# Patient Record
Sex: Male | Born: 1975 | Hispanic: Yes | Smoking: Never smoker
Health system: Southern US, Community
[De-identification: ages and names within clinical notes are randomized; demographics above are authoritative.]

## PROBLEM LIST (undated history)

## (undated) DIAGNOSIS — F419 Anxiety disorder, unspecified: Secondary | ICD-10-CM

## (undated) DIAGNOSIS — Z87442 Personal history of urinary calculi: Secondary | ICD-10-CM

## (undated) DIAGNOSIS — F329 Major depressive disorder, single episode, unspecified: Secondary | ICD-10-CM

## (undated) DIAGNOSIS — F32A Depression, unspecified: Secondary | ICD-10-CM

## (undated) HISTORY — PX: NO PAST SURGERIES: SHX2092

---

## 2005-12-29 ENCOUNTER — Emergency Department: Payer: Self-pay | Admitting: Emergency Medicine

## 2006-05-04 ENCOUNTER — Emergency Department: Payer: Self-pay | Admitting: Emergency Medicine

## 2019-02-15 ENCOUNTER — Other Ambulatory Visit: Payer: Self-pay

## 2019-02-15 ENCOUNTER — Emergency Department
Admission: EM | Admit: 2019-02-15 | Discharge: 2019-02-15 | Disposition: A | Discharge status: Home / Self Care | Payer: Self-pay | Attending: Emergency Medicine | Admitting: Emergency Medicine

## 2019-02-15 ENCOUNTER — Emergency Department: Payer: Self-pay

## 2019-02-15 DIAGNOSIS — N211 Calculus in urethra: Secondary | ICD-10-CM | POA: Insufficient documentation

## 2019-02-15 DIAGNOSIS — N200 Calculus of kidney: Secondary | ICD-10-CM

## 2019-02-15 LAB — CBC
HCT: 43.6 % (ref 39.0–52.0)
Hemoglobin: 14.7 g/dL (ref 13.0–17.0)
MCH: 28.7 pg (ref 26.0–34.0)
MCHC: 33.7 g/dL (ref 30.0–36.0)
MCV: 85 fL (ref 80.0–100.0)
PLATELETS: 293 10*3/uL (ref 150–400)
RBC: 5.13 MIL/uL (ref 4.22–5.81)
RDW: 12.6 % (ref 11.5–15.5)
WBC: 13.4 10*3/uL — ABNORMAL HIGH (ref 4.0–10.5)
nRBC: 0 % (ref 0.0–0.2)

## 2019-02-15 LAB — URINALYSIS, COMPLETE (UACMP) WITH MICROSCOPIC
Bacteria, UA: NONE SEEN
Bilirubin Urine: NEGATIVE
Glucose, UA: NEGATIVE mg/dL
KETONES UR: NEGATIVE mg/dL
Leukocytes,Ua: NEGATIVE
Nitrite: NEGATIVE
Protein, ur: 30 mg/dL — AB
Specific Gravity, Urine: 1.035 — ABNORMAL HIGH (ref 1.005–1.030)
pH: 5 (ref 5.0–8.0)

## 2019-02-15 LAB — COMPREHENSIVE METABOLIC PANEL
ALT: 39 U/L (ref 0–44)
AST: 32 U/L (ref 15–41)
Albumin: 4.6 g/dL (ref 3.5–5.0)
Alkaline Phosphatase: 86 U/L (ref 38–126)
Anion gap: 10 (ref 5–15)
BUN: 18 mg/dL (ref 6–20)
CO2: 23 mmol/L (ref 22–32)
Calcium: 9 mg/dL (ref 8.9–10.3)
Chloride: 106 mmol/L (ref 98–111)
Creatinine, Ser: 1.13 mg/dL (ref 0.61–1.24)
GFR calc non Af Amer: >60 mL/min (ref >60)
Glucose, Bld: 151 mg/dL — ABNORMAL HIGH (ref 70–99)
Potassium: 4.2 mmol/L (ref 3.5–5.1)
Sodium: 139 mmol/L (ref 135–145)
Total Bilirubin: 0.7 mg/dL (ref 0.3–1.2)
Total Protein: 8.5 g/dL — ABNORMAL HIGH (ref 6.5–8.1)

## 2019-02-15 LAB — CHLAMYDIA/NGC RT PCR (ARMC ONLY)
Chlamydia Tr: NOT DETECTED
N GONORRHOEAE: NOT DETECTED

## 2019-02-15 LAB — LIPASE, BLOOD: Lipase: 28 U/L (ref 11–51)

## 2019-02-15 MED ORDER — TAMSULOSIN HCL 0.4 MG PO CAPS: 0.4000 mg | ORAL_CAPSULE | Freq: Every day | ORAL | 0 refills | Status: DC

## 2019-02-15 MED ORDER — SODIUM CHLORIDE 0.9 % IV SOLN
Freq: Once | INTRAVENOUS | Status: AC
  Administered 2019-02-15: 09:00:00 via INTRAVENOUS

## 2019-02-15 MED ORDER — HYDROMORPHONE HCL 1 MG/ML IJ SOLN
0.5000 mg | Freq: Once | INTRAMUSCULAR | Status: AC
  Administered 2019-02-15: 0.5 mg via INTRAVENOUS
  Filled 2019-02-15: qty 1

## 2019-02-15 MED ORDER — ONDANSETRON HCL 4 MG PO TABS: 4.0000 mg | ORAL_TABLET | Freq: Every day | ORAL | 1 refills | Status: DC | PRN

## 2019-02-15 MED ORDER — KETOROLAC TROMETHAMINE 30 MG/ML IJ SOLN
30.0000 mg | Freq: Once | INTRAMUSCULAR | Status: AC
  Administered 2019-02-15: 30 mg via INTRAVENOUS
  Filled 2019-02-15: qty 1

## 2019-02-15 MED ORDER — OXYCODONE-ACETAMINOPHEN 7.5-325 MG PO TABS: 1.0000 | ORAL_TABLET | ORAL | 0 refills | Status: DC | PRN

## 2019-02-15 MED ORDER — ONDANSETRON HCL 4 MG/2ML IJ SOLN
4.0000 mg | Freq: Once | INTRAMUSCULAR | Status: AC
  Administered 2019-02-15: 4 mg via INTRAVENOUS
  Filled 2019-02-15: qty 2

## 2019-02-15 NOTE — ED Notes (Signed)
Pt to CT

## 2019-02-15 NOTE — ED Provider Notes (Signed)
[facility] Emergency Department Provider Note       Time seen: ----------------------------------------- 7:23 AM on 02/15/2019 -----------------------------------------   I have reviewed the triage vital signs and the nursing notes.  HISTORY   Chief Complaint Flank Pain    HPI Carlos Bauer is a 43 y.o. male with no known past medical history who presents to the ED for left leg pain that started yesterday.  Patient reports a history of same last year and was told it was his liver.  He reports urinary urgency and burning, also has some discharge.  He denies fevers, chills but has had vomiting.  No past medical history on file.  There are no active problems to display for this patient.   Allergies Nyquil multi-symptom [pseudoeph-doxylamine-dm-apap]  Social History Social History   Tobacco Use  . Smoking status: Not on file  Substance Use Topics  . Alcohol use: Not on file  . Drug use: Not on file    Review of Systems Constitutional: Negative for fever. Cardiovascular: Negative for chest pain. Respiratory: Negative for shortness of breath. Gastrointestinal: Positive for left flank pain Genitourinary: Positive for dysuria, hesitancy Musculoskeletal: Positive for left-sided back pain Skin: Negative for rash. Neurological: Negative for headaches, focal weakness or numbness.  All systems negative/normal/unremarkable except as stated in the HPI  ____________________________________________   PHYSICAL EXAM:  VITAL SIGNS: ED Triage Vitals  Enc Vitals Group     BP 02/15/19 0343 (!) 155/93     Pulse Rate 02/15/19 0343 79     Resp 02/15/19 0343 20     Temp 02/15/19 0343 98.9 F (37.2 C)     Temp Source 02/15/19 0343 Oral     SpO2 02/15/19 0343 98 %     Weight 02/15/19 0344 285 lb (129.3 kg)     Height --      Head Circumference --      Peak Flow --      Pain Score 02/15/19 0344 10     Pain Loc --      Pain Edu? --      Excl.  in GC? --     Constitutional: Alert and oriented.  Mild distress from pain Eyes: Conjunctivae are normal. Normal extraocular movements. ENT      Head: Normocephalic and atraumatic.      Nose: No congestion/rhinnorhea.      Mouth/Throat: Mucous membranes are moist.      Neck: No stridor. Cardiovascular: Normal rate, regular rhythm. No murmurs, rubs, or gallops. Respiratory: Normal respiratory effort without tachypnea nor retractions. Breath sounds are clear and equal bilaterally. No wheezes/rales/rhonchi. Gastrointestinal: Left flank tenderness, no rebound or guarding.  Normal bowel sounds. Musculoskeletal: Nontender with normal range of motion in extremities. No lower extremity tenderness nor edema. Neurologic:  Normal speech and language. No gross focal neurologic deficits are appreciated.  Skin:  Skin is warm, dry and intact. No rash noted. Psychiatric: Mood and affect are normal. Speech and behavior are normal.  ____________________________________________  ED COURSE:  As part of my medical decision making, I reviewed the following data within the electronic MEDICAL RECORD NUMBER History obtained from family if available, nursing notes, old chart and ekg, as well as notes from prior ED visits. Patient presented for left flank pain, we will assess with labs and imaging as indicated at this time. Clinical Course as of Feb 14 722  Tue Feb 15, 2019  0618 Potassium: 4.2 [PS]    Clinical Course User Index [PS] Scotty Court,  Aneta Mins, MD   Procedures ____________________________________________   LABS (pertinent positives/negatives)  Labs Reviewed  CBC - Abnormal; Notable for the following components:      Result Value   WBC 13.4 (*)    All other components within normal limits  COMPREHENSIVE METABOLIC PANEL - Abnormal; Notable for the following components:   Glucose, Bld 151 (*)    Total Protein 8.5 (*)    All other components within normal limits  URINALYSIS, COMPLETE (UACMP) WITH  MICROSCOPIC - Abnormal; Notable for the following components:   Color, Urine YELLOW (*)    APPearance CLEAR (*)    Specific Gravity, Urine 1.035 (*)    Hgb urine dipstick MODERATE (*)    Protein, ur 30 (*)    All other components within normal limits  CHLAMYDIA/NGC RT PCR ([facility] ONLY)  LIPASE, BLOOD    RADIOLOGY Images were viewed by me  CT renal protocol  IMPRESSION: 6 mm distal left ureteral stone with obstructive change in perinephric stranding.  Nonobstructing lower pole left renal stone.  No other focal abnormality is seen. ____________________________________________   DIFFERENTIAL DIAGNOSIS   Renal colic, UTI, pyelonephritis  FINAL ASSESSMENT AND PLAN  Renal colic   Plan: The patient had presented for left flank pain. Patient's labs did reveal some hematuria. Patient's imaging did reveal a 6 mm distal left ureteral stone with obstructive change.  Patient is currently improved with his pain.  He be discharged with pain medicine, Flomax and antiemetics.  He will be referred to urology for outpatient follow-up.   Ulice Dash, MD    Note: This note was generated in part or whole with voice recognition software. Voice recognition is usually quite accurate but there are transcription errors that can and very often do occur. I apologize for any typographical errors that were not detected and corrected.     Emily Filbert, MD 02/15/19 [phone]

## 2019-02-15 NOTE — ED Triage Notes (Addendum)
Pt in with co left flank that started yesterday, states has same symptoms last year and told it was his liver. Pt states he has urinary urgency and burning. Pt states he also has white discharge from penis.

## 2019-02-18 ENCOUNTER — Other Ambulatory Visit: Payer: Self-pay | Admitting: Urology

## 2019-02-18 ENCOUNTER — Telehealth: Payer: Self-pay | Admitting: Urology

## 2019-02-18 DIAGNOSIS — N201 Calculus of ureter: Secondary | ICD-10-CM

## 2019-02-18 NOTE — Telephone Encounter (Signed)
-----   Message from Riki Altes, MD sent at 02/18/2019  8:05 AM EDT ----- Regarding: Follow-up Seen in ED this week with a 6 mm distal stone.  Needs follow-up appointment within 1 to 2 weeks with a KUB.  KUB order was entered

## 2019-02-18 NOTE — Telephone Encounter (Signed)
App made and mailed Interpreter requested Patient is aware   Carlos Bauer

## 2019-03-02 ENCOUNTER — Other Ambulatory Visit: Payer: Self-pay

## 2019-03-02 ENCOUNTER — Ambulatory Visit
Admission: RE | Admit: 2019-03-02 | Discharge: 2019-03-02 | Disposition: A | Discharge status: Home / Self Care | Payer: Self-pay | Source: Ambulatory Visit | Attending: Urology | Admitting: Urology

## 2019-03-02 ENCOUNTER — Ambulatory Visit
Admission: RE | Admit: 2019-03-02 | Discharge: 2019-03-02 | Disposition: A | Discharge status: Home / Self Care | Payer: Self-pay | Attending: Urology | Admitting: Urology

## 2019-03-02 ENCOUNTER — Ambulatory Visit: Payer: Self-pay | Admitting: Urology

## 2019-03-02 DIAGNOSIS — N201 Calculus of ureter: Secondary | ICD-10-CM | POA: Insufficient documentation

## 2019-03-03 ENCOUNTER — Encounter: Payer: Self-pay | Admitting: Urology

## 2019-03-03 ENCOUNTER — Ambulatory Visit (INDEPENDENT_AMBULATORY_CARE_PROVIDER_SITE_OTHER): Payer: Self-pay | Admitting: Urology

## 2019-03-03 ENCOUNTER — Ambulatory Visit: Payer: Self-pay | Admitting: Urology

## 2019-03-03 VITALS — BP 129/73 | HR 64 | Ht 71.0 in | Wt 281.1 lb

## 2019-03-03 DIAGNOSIS — N201 Calculus of ureter: Secondary | ICD-10-CM

## 2019-03-03 DIAGNOSIS — N200 Calculus of kidney: Secondary | ICD-10-CM

## 2019-03-03 LAB — URINALYSIS, COMPLETE
BILIRUBIN UA: NEGATIVE
Glucose, UA: NEGATIVE
Ketones, UA: NEGATIVE
Leukocytes, UA: NEGATIVE
Nitrite, UA: NEGATIVE
RBC, UA: NEGATIVE
Specific Gravity, UA: 1.02 (ref 1.005–1.030)
Urobilinogen, Ur: 0.2 mg/dL (ref 0.2–1.0)
pH, UA: 6 (ref 5.0–7.5)

## 2019-03-03 MED ORDER — TAMSULOSIN HCL 0.4 MG PO CAPS: 0.4000 mg | ORAL_CAPSULE | Freq: Every day | ORAL | 0 refills | Status: AC

## 2019-03-03 NOTE — Patient Instructions (Signed)
Clculos renales Kidney Stones  Los clculos renales (urolitiasis) son depsitos slidos parecidos a una piedra que se forman dentro de los rganos que producen la orina (riones). Un clculo renal puede formarse en un rin y desplazarse a la vejiga, donde puede causar dolor intenso y obstruir el flujo de [place]. Los clculos renales se forman cuando hay niveles altos de ciertos minerales presentes en la orina. Suelen eliminarse a travs de la [place], [place], en algunos [place], se necesita tratamiento mdico para eliminarlos. Cules son las causas? Los clculos renales pueden ser causados por lo siguiente:  Una afeccin en la cual ciertas glndulas producen mucha hormona paratiroidea (hiperparatiroidismo primario), lo que causa demasiada acumulacin de calcio en la sangre.  La acumulacin de cristales de cido rico en la vejiga (hiperuricuria). El cido rico es un qumico que el cuerpo produce cuando se ingieren determinados alimentos. Suele eliminarse a travs de Ecologist.  El Scientist, research (medical) (estenosis) de los conductos que drenan orina de los riones a la vejiga (urteres).  Una obstruccin en el rin presente al nacer (obstruccin congnita).  Una ciruga realizada en el rin o los urteres, como una ciruga de revascularizacin. Qu incrementa el riesgo? Los siguientes factores pueden hacer que usted sea propenso a formar clculos renales:  Haber tenido un clculo renal en el pasado.  Tener antecedentes familiares de clculos renales.  No beber suficiente agua.  Seguir una dieta rica en protenas, sal (sodio) o azcar.  Tener exceso de [place] u obesidad. Cules son los signos o los sntomas? Los sntomas de clculos renales pueden incluir los siguientes:  Nuseas.  Vmitos.  Sangre en la orina (hematuria).  Dolor en el costado del abdomen, justo debajo de las costillas (dolor en fosa lumbar). Dolor que generalmente se expande (irradia) hacia la ingle.  Necesidad de Geographical information systems officer  con frecuencia o [place]. Cmo se diagnostica? Esta afeccin se puede diagnosticar en funcin de lo siguiente:  Sus antecedentes mdicos.  Un examen fsico.  Anlisis de sangre.  Anlisis de [place].  Exploracin por tomografa computarizada (TC).  Radiografa abdominal.  Una intervencin para examinar el interior de la vejiga (cistoscopia). Cmo se trata? El tratamiento para los clculos renales depende del tamao, la ubicacin y la composicin de los clculos. El tratamiento puede incluir lo siguiente:  Chiropractor la orina antes y despus de eliminar el clculo a travs de la orina.  Supervisin en el hospital hasta eliminar el clculo a travs de la [place].  Aumentar la ingesta de lquidos y disminuir la cantidad de calcio y protenas en la dieta.  Una intervencin para romper los clculos en la vejiga utilizando lo siguiente: ? Un haz de luz concentrado (terapia lser). ? Ondas de choque (litotricia extracorprea).  Ciruga para extraer los clculos renales. Esto ser necesario si siente dolor intenso o los clculos obstruyen las vas [place]. Siga estas indicaciones en su casa: Comida y bebida  Beba suficiente lquido como para mantener la orina clara o de color amarillo plido. Esto lo ayudar a eliminar el clculo renal.  Si se lo indican, modifique la dieta. Estos pueden incluir lo siguiente: ? Limitar la ingesta de sodio. ? Comer ms frutas y verduras. ? Limitar la ingesta de carne de res, carne de aves de corral, pescado y [place].  Siga las indicaciones del mdico respecto de las restricciones de comidas o bebidas. Instrucciones generales  Recoja una muestra de orina como se lo haya indicado el mdico. Deber recoger [place] de orina: ? 24horas despus de eliminar el clculo. ?  Entre 8 y 12semanas despus de haber eliminado el clculo, y cada 6 a despus de haberlo eliminado.  Cuele la orina cada vez que orine segn las indicaciones del mdico.  Use el colador que el mdico le haya recomendado.  No deseche el clculo renal despus de haberlo eliminado. Consrvelo para que el mdico Designer, jewellery. Analizar la composicin del clculo renal puede evitar la formacin de posibles clculos renales en el futuro.  Tome los medicamentos de venta libre y los recetados solamente como se lo haya indicado el mdico.  Oceanographer a todas las visitas de seguimiento como se lo haya indicado el mdico. Esto es importante. Es posible que le realicen radiografas o ecografas para asegurarse de que haya eliminado el clculo. Cmo se evita? Para prevenir otro clculo renal:  Beba suficiente lquido como para mantener la orina clara o de color amarillo plido. Esta es la mejor manera de prevenir la formacin de clculos renales.  Siga una dieta saludable y las recomendaciones de su mdico respecto de los alimentos que Personnel officer. Es posible que le indiquen que siga una dieta baja en protenas. Las recomendaciones varan segn el tipo de clculo renal que tenga.  Mantenga un peso saludable. Comunquese con un mdico si:  Tiene un dolor que empeora o que no mejora con los medicamentos. Solicite ayuda de inmediato si:  Tiene fiebre o siente escalofros.  Siente dolor intenso.  Siente dolor abdominal.  Se desmaya.  No puede orinar. Esta informacin no tiene Theme park manager el consejo del mdico. Asegrese de hacerle al mdico cualquier pregunta que tenga. Document Released: 11/24/2005 Document Revised: 05/07/2017 Document Reviewed: 05/09/2016 Elsevier Interactive Patient Education  2019 [employer].

## 2019-03-03 NOTE — Progress Notes (Signed)
03/03/2019 1:37 PM   Carlos Bauer 19-Jan-1976 [id number]  Referring provider: No referring provider defined for this encounter.  Chief Complaint  Patient presents with  . Nephrolithiasis    HPI: Carlos Bauer is a 43 year old male presented to the [facility] ED on 02/15/2019 with complaints of left flank pain and urinary urgency and dysuria.  He had nausea, vomiting but denied fever/chills.  He was noted to have left CVA tenderness on exam.  There were no identifiable precipitating, aggravating or alleviating factors.  Intensity was rated severe.  Urinalysis showed 11-20 RBCs.  A stone protocol CT was performed which showed a 6 mm left distal ureteral calculus and a small, nonobstructing left renal calculus.  He was discharged on tamsulosin, oral analgesics and antiemetics.  Since his ER discharge he is flank pain has resolved.  He is currently asymptomatic.  He denies prior history of stone disease or urologic problems.  He is not aware of passing a stone.   PMH: Past Medical History:  Diagnosis Date  . Kidney stone     Surgical History: History reviewed. No pertinent surgical history.  Home Medications:  Allergies as of 03/03/2019      Reactions   Nyquil Multi-symptom [pseudoeph-doxylamine-dm-apap] Swelling      Medication List       Accurate as of March 03, 2019  1:37 PM. Always use your most recent med list.        ondansetron 4 MG tablet Commonly known as:  Zofran Take 1 tablet (4 mg total) by mouth daily as needed for nausea or vomiting.   oxyCODONE-acetaminophen 7.5-325 MG tablet Commonly known as:  Percocet Take 1 tablet by mouth every 4 (four) hours as needed for severe pain.   tamsulosin 0.4 MG Caps capsule Commonly known as:  FLOMAX Take 1 capsule (0.4 mg total) by mouth daily after breakfast.       Allergies:  Allergies  Allergen Reactions  . Nyquil Multi-Symptom [Pseudoeph-Doxylamine-Dm-Apap] Swelling    Family History: No family  history on file.  Social History:  reports that he has never smoked. He has never used smokeless tobacco. He reports previous alcohol use. He reports that he does not use drugs.  ROS: UROLOGY Frequent Urination?: No Hard to postpone urination?: No Burning/pain with urination?: No Get up at night to urinate?: Yes Leakage of urine?: Yes Urine stream starts and stops?: No Trouble starting stream?: No Do you have to strain to urinate?: No Blood in urine?: No Urinary tract infection?: No Sexually transmitted disease?: No Injury to kidneys or bladder?: No Painful intercourse?: No Weak stream?: No Erection problems?: No Penile pain?: No  Gastrointestinal Nausea?: No Vomiting?: No Indigestion/heartburn?: No Diarrhea?: No Constipation?: No  Constitutional Fever: No Night sweats?: No Weight loss?: No Fatigue?: Yes  Skin Skin rash/lesions?: No Itching?: No  Eyes Blurred vision?: No Double vision?: No  Ears/Nose/Throat Sore throat?: No Sinus problems?: No  Hematologic/Lymphatic Swollen glands?: No Easy bruising?: No  Cardiovascular Leg swelling?: No Chest pain?: No  Respiratory Cough?: No Shortness of breath?: No  Endocrine Excessive thirst?: No  Musculoskeletal Back pain?: Yes Joint pain?: No  Neurological Headaches?: Yes Dizziness?: No  Psychologic Depression?: Yes Anxiety?: Yes  Physical Exam: BP 129/73 (BP Location: Left Arm, Patient Position: Sitting, Cuff Size: Large)   Pulse 64   Ht  (1.803 m)   Wt 281 lb 1.6 oz (127.5 kg)   BMI 39.21 kg/m   Constitutional:  Alert and oriented, No acute distress. HEENT:  [place] AT, moist mucus membranes.  Trachea midline, no masses. Cardiovascular: No clubbing, cyanosis, or edema. RRR Respiratory: Normal respiratory effort, no increased work of breathing. Clear GI: Abdomen is soft, nontender, nondistended, no abdominal masses GU: No CVA tenderness Lymph: No cervical or inguinal lymphadenopathy.  Skin: No rashes, bruises or suspicious lesions. Neurologic: Grossly intact, no focal deficits, moving all 4 extremities. Psychiatric: Normal mood and affect.   Pertinent Imaging: CT results were personally reviewed Results for orders placed during the hospital encounter of 03/02/19  Abdomen 1 view (KUB)   Narrative CLINICAL DATA:  History of left-sided renal stone.  EXAM: ABDOMEN - 1 VIEW  COMPARISON:  CT abdomen pelvis-02/15/2019  FINDINGS: There are 2 punctate opacities overlying the left hemipelvis with dominant opacity measuring approximately 0.6 cm potentially representative the distal left ureteral stone demonstrated on preceding noncontrast abdominal CT.  Previously identified nonobstructing left-sided renal stone is not seen overlying the left renal fossa and may have migrated to the level of the distal aspect of the left ureter as only one ureteral stone was demonstrated on CT scan performed 02/15/2019.  Otherwise, no definitive abnormal opacities overlie the expected location of either renal fossa, right ureter or the urinary bladder.  Moderate colonic stool burden without evidence of enteric obstruction.  No acute osseous abnormalities.  IMPRESSION: Suspected distal left ureteral stones as above.   Electronically Signed   By: Simonne Come M.D.   On: 03/02/2019 16:20     Results for orders placed during the hospital encounter of 02/15/19  CT Renal Stone Study   Narrative CLINICAL DATA:  Left-sided flank pain for several hours  EXAM: CT ABDOMEN AND PELVIS WITHOUT CONTRAST  TECHNIQUE: Multidetector CT imaging of the abdomen and pelvis was performed following the standard protocol without IV contrast.  COMPARISON:  None.  FINDINGS: Lower chest: No acute abnormality.  Hepatobiliary: The liver and gallbladder are within normal limits.  Pancreas: Unremarkable. No pancreatic ductal dilatation or surrounding inflammatory changes.  Spleen: Normal in  size without focal abnormality.  Adrenals/Urinary Tract: Adrenal glands and right kidney are well visualized and within normal limits. The bladder is decompressed. Left kidney demonstrates increased perinephric stranding as well as hydronephrosis secondary to a distal left ureteral stone measuring 6 mm. 3 mm left lower pole nonobstructing stone is noted.  Stomach/Bowel: The appendix is within normal limits. No obstructive or inflammatory changes of the larger small-bowel are seen. The stomach is within normal limits.  Vascular/Lymphatic: No significant vascular findings are present. No enlarged abdominal or pelvic lymph nodes.  Reproductive: Prostate is unremarkable.  Other: No abdominal wall hernia or abnormality. No abdominopelvic ascites.  Musculoskeletal: No acute or significant osseous findings.  IMPRESSION: 6 mm distal left ureteral stone with obstructive change in perinephric stranding.  Nonobstructing lower pole left renal stone.  No other focal abnormality is seen.   Electronically Signed   By: Alcide Clever M.D.   On: 02/15/2019 07:51     Assessment & Plan:   43 year old male with a 6 mm left distal ureteral calculus.  He is presently asymptomatic.  A KUB performed yesterday does show a faint calcification in the left true bony pelvis consistent with his stone.  We discussed various treatment options for urolithiasis including observation with or without medical expulsive therapy, shockwave lithotripsy (SWL), ureteroscopy and laser lithotripsy with stent placement, and percutaneous nephrolithotomy.  We discussed that management is based on stone size, location, density, patient co-morbidities, and patient preference.   Stones <32mm in size  have a >80% spontaneous passage rate. Data surrounding the use of tamsulosin for medical expulsive therapy is controversial, but meta analyses suggests it is most efficacious for distal stones between 5-46mm in size.  SWL has a  lower stone free rate in a single procedure, but also a lower complication rate compared to ureteroscopy and avoids a stent and associated stent related symptoms. Possible complications include renal hematoma, steinstrasse, and need for additional treatment.  Ureteroscopy with laser lithotripsy and stent placement has a higher stone free rate than SWL in a single procedure, however increased complication rate including possible infection, ureteral injury, bleeding, and stent related morbidity. Common stent related symptoms include dysuria, urgency/frequency, and flank pain.  After an extensive discussion of the risks and benefits of the above treatment options, the patient would like to proceed with a trial of medical expulsive therapy.  Refill tamsulosin was sent to his pharmacy.  He was instructed to call for recurrent renal colic and will otherwise follow-up in 2 weeks with a KUB.  A [place] interpreter was utilized during this visit.   Riki Altes, MD   [facility] [address], [address] [place], [place] [address] [phone]

## 2019-03-08 ENCOUNTER — Ambulatory Visit: Payer: Self-pay | Admitting: Urology

## 2019-03-15 ENCOUNTER — Encounter: Payer: Self-pay | Admitting: Urology

## 2019-03-15 ENCOUNTER — Encounter
Admission: RE | Admit: 2019-03-15 | Discharge: 2019-03-15 | Disposition: A | Discharge status: Home / Self Care | Payer: Self-pay | Source: Ambulatory Visit | Attending: Urology | Admitting: Urology

## 2019-03-15 ENCOUNTER — Ambulatory Visit
Admission: RE | Admit: 2019-03-15 | Discharge: 2019-03-15 | Disposition: A | Discharge status: Home / Self Care | Payer: Self-pay | Source: Ambulatory Visit | Attending: Urology | Admitting: Urology

## 2019-03-15 ENCOUNTER — Ambulatory Visit
Admission: RE | Admit: 2019-03-15 | Discharge: 2019-03-15 | Disposition: A | Discharge status: Home / Self Care | Payer: Self-pay | Attending: Urology | Admitting: Urology

## 2019-03-15 ENCOUNTER — Ambulatory Visit (INDEPENDENT_AMBULATORY_CARE_PROVIDER_SITE_OTHER): Payer: Self-pay | Admitting: Urology

## 2019-03-15 ENCOUNTER — Other Ambulatory Visit: Payer: Self-pay | Admitting: Radiology

## 2019-03-15 ENCOUNTER — Other Ambulatory Visit: Payer: Self-pay

## 2019-03-15 ENCOUNTER — Telehealth: Payer: Self-pay | Admitting: Radiology

## 2019-03-15 DIAGNOSIS — N201 Calculus of ureter: Secondary | ICD-10-CM

## 2019-03-15 DIAGNOSIS — N200 Calculus of kidney: Secondary | ICD-10-CM

## 2019-03-15 DIAGNOSIS — N132 Hydronephrosis with renal and ureteral calculous obstruction: Secondary | ICD-10-CM | POA: Insufficient documentation

## 2019-03-15 DIAGNOSIS — N23 Unspecified renal colic: Secondary | ICD-10-CM

## 2019-03-15 HISTORY — DX: Depression, unspecified: F32.A

## 2019-03-15 HISTORY — DX: Personal history of urinary calculi: Z87.442

## 2019-03-15 HISTORY — DX: Anxiety disorder, unspecified: F41.9

## 2019-03-15 HISTORY — DX: Major depressive disorder, single episode, unspecified: F32.9

## 2019-03-15 NOTE — H&P (View-Only) (Signed)
03/15/2019 9:13 AM   Carlos Bauer 1976/10/07 [id number]  Referring provider: No referring provider defined for this encounter.  Chief Complaint  Patient presents with  . Nephrolithiasis    HPI: [age] year old male presents for follow-up of a left distal ureteral calculus.  He was initially seen on 03/03/2019 after an ED visit on 02/15/2019.  CT showed a 6 mm left distal ureteral calculus and a smaller 3 mm calculus just proximal.  He initially desired a trial of passage and presents for follow-up.  He continues to have intermittent pain and rates his pain this morning at 7/10.  Denies fever, chills, nausea or vomiting.  KUB performed today was reviewed and the calculus is still present in the left true bony pelvis.   PMH: Past Medical History:  Diagnosis Date  . Kidney stone     Surgical History: No past surgical history on file.  Home Medications:  Allergies as of 03/15/2019      Reactions   Nyquil Multi-symptom [pseudoeph-doxylamine-dm-apap] Swelling      Medication List       Accurate as of March 15, 2019  9:13 AM. Always use your most recent med list.        ondansetron 4 MG tablet Commonly known as:  Zofran Take 1 tablet (4 mg total) by mouth daily as needed for nausea or vomiting.   oxyCODONE-acetaminophen 7.5-325 MG tablet Commonly known as:  Percocet Take 1 tablet by mouth every 4 (four) hours as needed for severe pain.   tamsulosin 0.4 MG Caps capsule Commonly known as:  FLOMAX Take 1 capsule (0.4 mg total) by mouth daily after breakfast.       Allergies:  Allergies  Allergen Reactions  . Nyquil Multi-Symptom [Pseudoeph-Doxylamine-Dm-Apap] Swelling    Family History: No family history on file.  Social History:  reports that he has never smoked. He has never used smokeless tobacco. He reports previous alcohol use. He reports that he does not use drugs.  ROS: UROLOGY Frequent Urination?: No Hard to postpone urination?: Yes Burning/pain  with urination?: Yes Get up at night to urinate?: Yes Leakage of urine?: Yes Urine stream starts and stops?: Yes Trouble starting stream?: No Do you have to strain to urinate?: No Blood in urine?: No Urinary tract infection?: Yes Sexually transmitted disease?: No Injury to kidneys or bladder?: No Painful intercourse?: Yes Weak stream?: Yes Erection problems?: Yes Penile pain?: Yes  Gastrointestinal Nausea?: No Vomiting?: No Indigestion/heartburn?: No Diarrhea?: No Constipation?: No  Constitutional Fever: No Night sweats?: No Weight loss?: No Fatigue?: Yes  Skin Skin rash/lesions?: No Itching?: No  Eyes Blurred vision?: No Double vision?: No  Ears/Nose/Throat Sore throat?: No Sinus problems?: No  Hematologic/Lymphatic Swollen glands?: No Easy bruising?: No  Cardiovascular Leg swelling?: No Chest pain?: No  Respiratory Cough?: No Shortness of breath?: No  Endocrine Excessive thirst?: No  Musculoskeletal Back pain?: Yes Joint pain?: Yes  Neurological Headaches?: Yes Dizziness?: No  Psychologic Depression?: Yes Anxiety?: Yes  Physical Exam: BP 122/74 (BP Location: Left Arm, Patient Position: Sitting, Cuff Size: Large)   Pulse 63   Ht 5\' 11"  (1.803 m)   Wt 281 lb (127.5 kg)   BMI 39.19 kg/m   Constitutional:  Alert and oriented, No acute distress. HEENT: [place] AT, moist mucus membranes.  Trachea midline, no masses. Cardiovascular: No clubbing, cyanosis, or edema.  RRR Respiratory: Normal respiratory effort, no increased work of breathing.  Clear GI: Abdomen is soft, nontender, nondistended, no abdominal masses GU: No CVA  tenderness Lymph: No cervical or inguinal lymphadenopathy. Skin: No rashes, bruises or suspicious lesions. Neurologic: Grossly intact, no focal deficits, moving all 4 extremities. Psychiatric: Normal mood and affect.  Laboratory Data: Lab Results  Component Value Date   WBC 13.4 (H) 02/15/2019   HGB 14.7 02/15/2019    HCT 43.6 02/15/2019   MCV 85.0 02/15/2019   PLT 293 02/15/2019    Lab Results  Component Value Date   CREATININE 1.13 02/15/2019    Pertinent Imaging: Images were personally reviewed Results for orders placed during the hospital encounter of 03/15/19  Abdomen 1 view (KUB)   Narrative CLINICAL DATA:  Pain in left side of abdomen radiating around to posterior flank area, known kidney stone, no known blood in urine seen by patient, no surgery to abdomen  EXAM: ABDOMEN - 1 VIEW  COMPARISON:  03/02/2019  FINDINGS: Two adjacent calculi noted in the left pelvis on the prior exam are unchanged. Based on a CT dated 02/15/2019, these were in the distal left ureter, appearing as 1 stone on that exam.  No visualized intrarenal stones and no other evidence of a ureteral stone.  Normal bowel gas pattern.  Skeletal structures are unremarkable.  IMPRESSION: 1. Two adjacent stones in the left pelvis are consistent with distal left ureteral stones, unchanged when compared to the prior radiographs and from a CT dated 02/15/2019. 2. No other abnormalities.   Electronically Signed   By: Amie Portland M.D.   On: 03/15/2019 08:42     Results for orders placed during the hospital encounter of 02/15/19  CT Renal Stone Study   Narrative CLINICAL DATA:  Left-sided flank pain for several hours  EXAM: CT ABDOMEN AND PELVIS WITHOUT CONTRAST  TECHNIQUE: Multidetector CT imaging of the abdomen and pelvis was performed following the standard protocol without IV contrast.  COMPARISON:  None.  FINDINGS: Lower chest: No acute abnormality.  Hepatobiliary: The liver and gallbladder are within normal limits.  Pancreas: Unremarkable. No pancreatic ductal dilatation or surrounding inflammatory changes.  Spleen: Normal in size without focal abnormality.  Adrenals/Urinary Tract: Adrenal glands and right kidney are well visualized and within normal limits. The bladder is decompressed.  Left kidney demonstrates increased perinephric stranding as well as hydronephrosis secondary to a distal left ureteral stone measuring 6 mm. 3 mm left lower pole nonobstructing stone is noted.  Stomach/Bowel: The appendix is within normal limits. No obstructive or inflammatory changes of the larger small-bowel are seen. The stomach is within normal limits.  Vascular/Lymphatic: No significant vascular findings are present. No enlarged abdominal or pelvic lymph nodes.  Reproductive: Prostate is unremarkable.  Other: No abdominal wall hernia or abnormality. No abdominopelvic ascites.  Musculoskeletal: No acute or significant osseous findings.  IMPRESSION: 6 mm distal left ureteral stone with obstructive change in perinephric stranding.  Nonobstructing lower pole left renal stone.  No other focal abnormality is seen.   Electronically Signed   By: Alcide Clever M.D.   On: 02/15/2019 07:51     Assessment & Plan:   43 year old male with a left distal ureteral calculus and failed medical expulsion therapy.  We again discussed treatment options of shockwave lithotripsy and ureteroscopic removal.  He was informed that the most successful procedure would be ureteroscopy with a success rate of >90%.  He desires to schedule.  The indications and nature of the planned procedure were discussed as well as the potential  benefits and expected outcome.  Alternatives have been discussed in detail. The most common complications and  side effects were discussed including but not limited to infection/sepsis; blood loss; damage to urethra, bladder, ureter; need for prolonged stent placement as well as general anesthesia risks. Although uncommon she was also informed of the possibility that the calculus may not be able to be treated due to inability to obtain access to the ureter. In that event he would require stent placement and a follow-up procedure after a period of stent dilation. All of his  questions were answered and he desires to proceed.   A [place] interpreter was utilized during this visit.   Riki Altes, MD  [facility] [address], [address] [place], [place] [address] [phone]

## 2019-03-15 NOTE — Patient Instructions (Signed)
Your procedure is scheduled on: 03-18-19 FRIDAY Su procedimiento est programado para: Report to [facility] 2ND FLOOR Presntese a: To find out your arrival time please call [phone] between 1PM - 3PM on 03-17-19 THURSDAY Para saber su hora de llegada por favor llame al [phone] entre la 1PM - 3PM el da:   Remember: Instructions that are not followed completely may result in serious medical risk, up to and including death,  or upon the discretion of your surgeon and anesthesiologist your surgery may need to be rescheduled.  Recuerde: Las instrucciones que no se siguen completamente Armed forces logistics/support/administrative officer en un riesgo de salud grave, incluyendo hasta  la [place] o a discrecin de su cirujano y Scientific laboratory technician, su ciruga se puede posponer.   __X_ 1.Do not eat food after midnight the night before your procedure. No    gum chewing or hard candies. You may drink clear liquids up to 2 hours     before you are scheduled to arrive for your surgery- DO not drink clear     Liquids within 2 hours of the start of your surgery.     Clear Liquids include:    water, apple juice without pulp, clear carbohydrate drink such as    Clearfast of [place], Black Coffee or Tea (Do not add anything to coffee or tea).      No coma nada despus de la medianoche de la noche anterior a su    procedimiento. No coma chicles ni caramelos duros. Puede tomar    lquidos claros hasta 2 horas antes de su hora programada de llegada al     hospital para su procedimiento. No tome lquidos claros durante el     transcurso de las 2 horas de su llegada programada al hospital para su     procedimiento, ya que esto puede llevar a que su procedimiento se    retrase o tenga que volver a Magazine features editor.  Los lquidos claros incluyen:          - Agua o jugo de [place] sin pulpa          - Bebidas claras con carbohidratos como ClearFast o Gatorade          - Caf negro o t claro (sin leche, sin cremas, no agregue  nada al caf ni al t)  No tome nada que no est en esta lista.  Los pacientes con diabetes tipo 1 y tipo 2 solo deben Printmaker.  Llame a la clnica de PreCare o a la unidad de Same Day Surgery si  tiene alguna pregunta sobre estas instrucciones.              _X__ 2.Do Not Smoke or use e-cigarettes For 24 Hours Prior to Your Surgery.    Do not use any chewable tobacco products for at least 6   hours prior to surgery.    No fume ni use cigarrillos electrnicos durante las 24 horas previas    a su [place].  No use ningn producto de tabaco masticable durante   al menos 6 horas antes de la [place].     __X_ 3. No alcohol for 24 hours before or after surgery.    No tome alcohol durante las 24 horas antes ni despus de la [place].   ____4. Bring all medications with you on the day of surgery if instructed.    Lleve todos los medicamentos con usted el da de su ciruga si se le  ha indicado as.   ____ 5. Notify your doctor if there is any change in your medical condition (cold,fever, infections).    Informe a su mdico si hay algn cambio en su condicin mdica  (resfriado, fiebre, infecciones).   Do not wear jewelry, make-up, hairpins, clips or nail polish.  No use joyas, maquillajes, pinzas/ganchos para el cabello ni esmalte de uas.  Do not wear lotions, powders, or perfumes. You may wear deodorant.  No use lociones, polvos o perfumes.  Puede usar desodorante.    Do not shave 48 hours prior to surgery. Men may shave face and neck.  No se afeite 48 horas antes de la [place].  Los hombres pueden [employer] cara  y el cuello.   Do not bring valuables to the hospital.   No lleve objetos de valor al hospital.  [facility] is not responsible for any belongings or valuables.  [place] no se hace responsable de ningn tipo de pertenencias u objetos de Licensed conveyancer.               Contacts, dentures or bridgework may not be worn into surgery.  Los lentes de [place], las dentaduras  postizas o puentes no se pueden usar en la [place].   Leave your suitcase in the car. After surgery it may be brought to your room.  Deje su maleta en el auto.  Despus de la ciruga podr traerla a su habitacin.   For patients admitted to the hospital, discharge time is determined by your  treatment team.  Para los pacientes que sean ingresados al hospital, el tiempo en el cual se le  dar de alta es determinado por su equipo de [place].   Patients discharged the day of surgery will not be allowed to drive home. A los pacientes que se les da de alta el mismo da de la ciruga no se les permitir conducir a Higher education careers adviser.   Please read over the following fact sheets that you were given: Por favor lea las siguientes hojas de informacin que le dieron:     __X__ Take these medicines the morning of surgery with A SIP OF WATER:          [place] medicinas la maana de la ciruga con UN SORBO DE AGUA:  1.TAMSULOSIN (FLOMAX)  2.   3.   4.       5.  6.  ____ Fleet Enema (as directed)          Enema de [place] (segn lo indicado)    ____ Use CHG Soap as directed          Utilice el jabn de CHG segn lo indicado  ____ Use inhalers on the day of surgery          Use los inhaladores el da de la ciruga  ____ Stop metformin 2 days prior to surgery          Deje de tomar el metformin 2 das antes de la ciruga    ____ Take 1/2 of usual insulin dose the night before surgery and none on the morning of surgery           Tome la mitad de la dosis habitual de insulina la noche antes de la [place] y no tome nada en la maana de la             ciruga  ____ Stop Coumadin/Plavix/aspirin on           Deje de tomar el  Coumadin/Plavix/aspirina el da:  _X___ Stop Anti-inflammatories NOW          Deje de tomar antiinflamatorios el da:   ____ Stop supplements until after surgery            Deje de tomar suplementos hasta despus de la ciruga  ____ Bring C-Pap to the hospital          Lleve  el C-Pap al hospital

## 2019-03-15 NOTE — Telephone Encounter (Signed)
[facility] Surgery Information form below as well as the Instructions for Pre-Admission Testing form & a map of [facility] were provided to patient in Spanish and interpreted by Maryjane Hurter.   [address], [address] [place], [place] [address] Telephone: [phone] Fax: [phone]   Thank you for choosing [facility] for your upcoming surgery!  We are always here to assist in your urological needs.  Please read the following information with specific details for your upcoming appointments related to your surgery. Please contact Kisha Messman at [phone] Option 3 with any questions.  The Name of Your Surgery: Left ureteroscopy, laser lithotripsy, stone removal, ureteral stent placement Your Surgery Date: 03/18/2019 Your Surgeon: Irineo Axon  Please call Same Day Surgery at [phone] between the hours of 1pm-3pm one day prior to your surgery. They will inform you of the time to arrive at Same Day Surgery which is located on the second floor of the [facility].   Please refer to the attached letter regarding instructions for Pre-Admission Testing. You will receive a call from the Pre-Admission Testing office regarding your appointment with them.  The Pre-Admission Testing office is located at [address] [facility], on the first floor of the [facility] in [address] (office is to the right as you enter through the [employer] of the [employer]). Please have all medications you are currently taking and your insurance card available.   Patient was advised to have nothing to eat or drink after midnight the night prior to surgery except that he may have only water until 2 hours before surgery with nothing to drink within 2 hours of surgery.  The patient states he currently takes no blood thinners.  Patient's questions were answered and he expressed  understanding of these instructions.

## 2019-03-15 NOTE — Progress Notes (Signed)
03/15/2019 9:13 AM   Carlos Bauer 1976/10/07 [id number]  Referring provider: No referring provider defined for this encounter.  Chief Complaint  Patient presents with  . Nephrolithiasis    HPI: [age] year old male presents for follow-up of a left distal ureteral calculus.  He was initially seen on 03/03/2019 after an ED visit on 02/15/2019.  CT showed a 6 mm left distal ureteral calculus and a smaller 3 mm calculus just proximal.  He initially desired a trial of passage and presents for follow-up.  He continues to have intermittent pain and rates his pain this morning at 7/10.  Denies fever, chills, nausea or vomiting.  KUB performed today was reviewed and the calculus is still present in the left true bony pelvis.   PMH: Past Medical History:  Diagnosis Date  . Kidney stone     Surgical History: No past surgical history on file.  Home Medications:  Allergies as of 03/15/2019      Reactions   Nyquil Multi-symptom [pseudoeph-doxylamine-dm-apap] Swelling      Medication List       Accurate as of March 15, 2019  9:13 AM. Always use your most recent med list.        ondansetron 4 MG tablet Commonly known as:  Zofran Take 1 tablet (4 mg total) by mouth daily as needed for nausea or vomiting.   oxyCODONE-acetaminophen 7.5-325 MG tablet Commonly known as:  Percocet Take 1 tablet by mouth every 4 (four) hours as needed for severe pain.   tamsulosin 0.4 MG Caps capsule Commonly known as:  FLOMAX Take 1 capsule (0.4 mg total) by mouth daily after breakfast.       Allergies:  Allergies  Allergen Reactions  . Nyquil Multi-Symptom [Pseudoeph-Doxylamine-Dm-Apap] Swelling    Family History: No family history on file.  Social History:  reports that he has never smoked. He has never used smokeless tobacco. He reports previous alcohol use. He reports that he does not use drugs.  ROS: UROLOGY Frequent Urination?: No Hard to postpone urination?: Yes Burning/pain  with urination?: Yes Get up at night to urinate?: Yes Leakage of urine?: Yes Urine stream starts and stops?: Yes Trouble starting stream?: No Do you have to strain to urinate?: No Blood in urine?: No Urinary tract infection?: Yes Sexually transmitted disease?: No Injury to kidneys or bladder?: No Painful intercourse?: Yes Weak stream?: Yes Erection problems?: Yes Penile pain?: Yes  Gastrointestinal Nausea?: No Vomiting?: No Indigestion/heartburn?: No Diarrhea?: No Constipation?: No  Constitutional Fever: No Night sweats?: No Weight loss?: No Fatigue?: Yes  Skin Skin rash/lesions?: No Itching?: No  Eyes Blurred vision?: No Double vision?: No  Ears/Nose/Throat Sore throat?: No Sinus problems?: No  Hematologic/Lymphatic Swollen glands?: No Easy bruising?: No  Cardiovascular Leg swelling?: No Chest pain?: No  Respiratory Cough?: No Shortness of breath?: No  Endocrine Excessive thirst?: No  Musculoskeletal Back pain?: Yes Joint pain?: Yes  Neurological Headaches?: Yes Dizziness?: No  Psychologic Depression?: Yes Anxiety?: Yes  Physical Exam: BP 122/74 (BP Location: Left Arm, Patient Position: Sitting, Cuff Size: Large)   Pulse 63   Ht 5\' 11"  (1.803 m)   Wt 281 lb (127.5 kg)   BMI 39.19 kg/m   Constitutional:  Alert and oriented, No acute distress. HEENT: [place] AT, moist mucus membranes.  Trachea midline, no masses. Cardiovascular: No clubbing, cyanosis, or edema.  RRR Respiratory: Normal respiratory effort, no increased work of breathing.  Clear GI: Abdomen is soft, nontender, nondistended, no abdominal masses GU: No CVA  tenderness Lymph: No cervical or inguinal lymphadenopathy. Skin: No rashes, bruises or suspicious lesions. Neurologic: Grossly intact, no focal deficits, moving all 4 extremities. Psychiatric: Normal mood and affect.  Laboratory Data: Lab Results  Component Value Date   WBC 13.4 (H) 02/15/2019   HGB 14.7 02/15/2019    HCT 43.6 02/15/2019   MCV 85.0 02/15/2019   PLT 293 02/15/2019    Lab Results  Component Value Date   CREATININE 1.13 02/15/2019    Pertinent Imaging: Images were personally reviewed Results for orders placed during the hospital encounter of 03/15/19  Abdomen 1 view (KUB)   Narrative CLINICAL DATA:  Pain in left side of abdomen radiating around to posterior flank area, known kidney stone, no known blood in urine seen by patient, no surgery to abdomen  EXAM: ABDOMEN - 1 VIEW  COMPARISON:  03/02/2019  FINDINGS: Two adjacent calculi noted in the left pelvis on the prior exam are unchanged. Based on a CT dated 02/15/2019, these were in the distal left ureter, appearing as 1 stone on that exam.  No visualized intrarenal stones and no other evidence of a ureteral stone.  Normal bowel gas pattern.  Skeletal structures are unremarkable.  IMPRESSION: 1. Two adjacent stones in the left pelvis are consistent with distal left ureteral stones, unchanged when compared to the prior radiographs and from a CT dated 02/15/2019. 2. No other abnormalities.   Electronically Signed   By: Amie Portland M.D.   On: 03/15/2019 08:42     Results for orders placed during the hospital encounter of 02/15/19  CT Renal Stone Study   Narrative CLINICAL DATA:  Left-sided flank pain for several hours  EXAM: CT ABDOMEN AND PELVIS WITHOUT CONTRAST  TECHNIQUE: Multidetector CT imaging of the abdomen and pelvis was performed following the standard protocol without IV contrast.  COMPARISON:  None.  FINDINGS: Lower chest: No acute abnormality.  Hepatobiliary: The liver and gallbladder are within normal limits.  Pancreas: Unremarkable. No pancreatic ductal dilatation or surrounding inflammatory changes.  Spleen: Normal in size without focal abnormality.  Adrenals/Urinary Tract: Adrenal glands and right kidney are well visualized and within normal limits. The bladder is decompressed.  Left kidney demonstrates increased perinephric stranding as well as hydronephrosis secondary to a distal left ureteral stone measuring 6 mm. 3 mm left lower pole nonobstructing stone is noted.  Stomach/Bowel: The appendix is within normal limits. No obstructive or inflammatory changes of the larger small-bowel are seen. The stomach is within normal limits.  Vascular/Lymphatic: No significant vascular findings are present. No enlarged abdominal or pelvic lymph nodes.  Reproductive: Prostate is unremarkable.  Other: No abdominal wall hernia or abnormality. No abdominopelvic ascites.  Musculoskeletal: No acute or significant osseous findings.  IMPRESSION: 6 mm distal left ureteral stone with obstructive change in perinephric stranding.  Nonobstructing lower pole left renal stone.  No other focal abnormality is seen.   Electronically Signed   By: Alcide Clever M.D.   On: 02/15/2019 07:51     Assessment & Plan:   43 year old male with a left distal ureteral calculus and failed medical expulsion therapy.  We again discussed treatment options of shockwave lithotripsy and ureteroscopic removal.  He was informed that the most successful procedure would be ureteroscopy with a success rate of >90%.  He desires to schedule.  The indications and nature of the planned procedure were discussed as well as the potential  benefits and expected outcome.  Alternatives have been discussed in detail. The most common complications and  side effects were discussed including but not limited to infection/sepsis; blood loss; damage to urethra, bladder, ureter; need for prolonged stent placement as well as general anesthesia risks. Although uncommon she was also informed of the possibility that the calculus may not be able to be treated due to inability to obtain access to the ureter. In that event he would require stent placement and a follow-up procedure after a period of stent dilation. All of his  questions were answered and he desires to proceed.   A [place] interpreter was utilized during this visit.   Mikesha Migliaccio C Lejend Dalby, MD  [facility] [address], [address] [place], [place] [address] [phone]  

## 2019-03-17 LAB — CULTURE, URINE COMPREHENSIVE

## 2019-03-17 MED ORDER — CEFAZOLIN SODIUM-DEXTROSE 2-4 GM/100ML-% IV SOLN
2.0000 g | INTRAVENOUS | Status: AC
  Administered 2019-03-18: 08:00:00 2 g via INTRAVENOUS

## 2019-03-18 ENCOUNTER — Ambulatory Visit: Payer: Self-pay | Admitting: Anesthesiology

## 2019-03-18 ENCOUNTER — Encounter: Payer: Self-pay | Admitting: *Deleted

## 2019-03-18 ENCOUNTER — Ambulatory Visit
Admission: RE | Admit: 2019-03-18 | Discharge: 2019-03-18 | Disposition: A | Discharge status: Home / Self Care | Payer: Self-pay | Source: Ambulatory Visit | Attending: Urology | Admitting: Urology

## 2019-03-18 ENCOUNTER — Other Ambulatory Visit: Payer: Self-pay

## 2019-03-18 ENCOUNTER — Encounter
Admission: RE | Disposition: A | Discharge status: Home / Self Care | Payer: Self-pay | Source: Ambulatory Visit | Attending: Urology

## 2019-03-18 DIAGNOSIS — N201 Calculus of ureter: Secondary | ICD-10-CM

## 2019-03-18 HISTORY — PX: CYSTOSCOPY/URETEROSCOPY/HOLMIUM LASER/STENT PLACEMENT: SHX6546

## 2019-03-18 SURGERY — CYSTOSCOPY/URETEROSCOPY/HOLMIUM LASER/STENT PLACEMENT
Anesthesia: General | Laterality: Left

## 2019-03-18 MED ORDER — ROCURONIUM BROMIDE 50 MG/5ML IV SOLN
INTRAVENOUS | Status: AC
  Filled 2019-03-18: qty 1

## 2019-03-18 MED ORDER — OXYCODONE HCL 5 MG PO TABS
ORAL_TABLET | ORAL | Status: AC
  Filled 2019-03-18: qty 1

## 2019-03-18 MED ORDER — SUCCINYLCHOLINE CHLORIDE 20 MG/ML IJ SOLN
INTRAMUSCULAR | Status: AC
  Filled 2019-03-18: qty 1

## 2019-03-18 MED ORDER — LIDOCAINE HCL (CARDIAC) PF 100 MG/5ML IV SOSY
PREFILLED_SYRINGE | INTRAVENOUS | Status: DC | PRN
  Administered 2019-03-18: 100 mg via INTRAVENOUS

## 2019-03-18 MED ORDER — FAMOTIDINE 20 MG PO TABS
ORAL_TABLET | ORAL | Status: AC
  Filled 2019-03-18: qty 1

## 2019-03-18 MED ORDER — MIDAZOLAM HCL 2 MG/2ML IJ SOLN
INTRAMUSCULAR | Status: DC | PRN
  Administered 2019-03-18: 2 mg via INTRAVENOUS

## 2019-03-18 MED ORDER — LACTATED RINGERS IV SOLN
INTRAVENOUS | Status: DC
  Administered 2019-03-18: 07:00:00 via INTRAVENOUS

## 2019-03-18 MED ORDER — PROPOFOL 10 MG/ML IV BOLUS
INTRAVENOUS | Status: AC
  Filled 2019-03-18: qty 20

## 2019-03-18 MED ORDER — OXYBUTYNIN CHLORIDE 5 MG PO TABS: ORAL_TABLET | ORAL | 0 refills | Status: AC

## 2019-03-18 MED ORDER — FENTANYL CITRATE (PF) 100 MCG/2ML IJ SOLN
INTRAMUSCULAR | Status: DC | PRN
  Administered 2019-03-18: 50 ug via INTRAVENOUS
  Administered 2019-03-18: 100 ug via INTRAVENOUS
  Administered 2019-03-18: 50 ug via INTRAVENOUS

## 2019-03-18 MED ORDER — KETOROLAC TROMETHAMINE 30 MG/ML IJ SOLN
INTRAMUSCULAR | Status: AC
  Filled 2019-03-18: qty 1

## 2019-03-18 MED ORDER — MIDAZOLAM HCL 2 MG/2ML IJ SOLN
INTRAMUSCULAR | Status: AC
  Filled 2019-03-18: qty 2

## 2019-03-18 MED ORDER — KETOROLAC TROMETHAMINE 30 MG/ML IJ SOLN
INTRAMUSCULAR | Status: DC | PRN
  Administered 2019-03-18: 30 mg via INTRAVENOUS

## 2019-03-18 MED ORDER — OXYCODONE HCL 5 MG/5ML PO SOLN: 5.0000 mg | Freq: Once | ORAL | Status: AC | PRN

## 2019-03-18 MED ORDER — PROPOFOL 10 MG/ML IV BOLUS
INTRAVENOUS | Status: DC | PRN
  Administered 2019-03-18: 50 mg via INTRAVENOUS
  Administered 2019-03-18: 150 mg via INTRAVENOUS

## 2019-03-18 MED ORDER — SEVOFLURANE IN SOLN
RESPIRATORY_TRACT | Status: AC
  Filled 2019-03-18: qty 250

## 2019-03-18 MED ORDER — SUCCINYLCHOLINE CHLORIDE 20 MG/ML IJ SOLN
INTRAMUSCULAR | Status: DC | PRN
  Administered 2019-03-18: 100 mg via INTRAVENOUS

## 2019-03-18 MED ORDER — DEXAMETHASONE SODIUM PHOSPHATE 10 MG/ML IJ SOLN
INTRAMUSCULAR | Status: DC | PRN
  Administered 2019-03-18: 10 mg via INTRAVENOUS

## 2019-03-18 MED ORDER — PHENYLEPHRINE HCL (PRESSORS) 10 MG/ML IV SOLN
INTRAVENOUS | Status: AC
  Filled 2019-03-18: qty 1

## 2019-03-18 MED ORDER — LIDOCAINE HCL (PF) 2 % IJ SOLN
INTRAMUSCULAR | Status: AC
  Filled 2019-03-18: qty 10

## 2019-03-18 MED ORDER — ONDANSETRON HCL 4 MG/2ML IJ SOLN
INTRAMUSCULAR | Status: DC | PRN
  Administered 2019-03-18: 4 mg via INTRAVENOUS

## 2019-03-18 MED ORDER — OXYCODONE HCL 5 MG PO TABS
5.0000 mg | ORAL_TABLET | Freq: Once | ORAL | Status: AC | PRN
  Administered 2019-03-18: 5 mg via ORAL

## 2019-03-18 MED ORDER — IOPAMIDOL (ISOVUE-200) INJECTION 41%
INTRAVENOUS | Status: DC | PRN
  Administered 2019-03-18: 09:00:00 10 mL

## 2019-03-18 MED ORDER — PROMETHAZINE HCL 25 MG/ML IJ SOLN: 6.2500 mg | INTRAMUSCULAR | Status: DC | PRN

## 2019-03-18 MED ORDER — ONDANSETRON HCL 4 MG/2ML IJ SOLN
INTRAMUSCULAR | Status: AC
  Filled 2019-03-18: qty 2

## 2019-03-18 MED ORDER — FENTANYL CITRATE (PF) 100 MCG/2ML IJ SOLN: 25.0000 ug | INTRAMUSCULAR | Status: DC | PRN

## 2019-03-18 MED ORDER — CEFAZOLIN SODIUM-DEXTROSE 2-4 GM/100ML-% IV SOLN
INTRAVENOUS | Status: AC
  Filled 2019-03-18: qty 100

## 2019-03-18 MED ORDER — FAMOTIDINE 20 MG PO TABS
20.0000 mg | ORAL_TABLET | Freq: Once | ORAL | Status: AC
  Administered 2019-03-18: 20 mg via ORAL

## 2019-03-18 MED ORDER — OXYCODONE-ACETAMINOPHEN 7.5-325 MG PO TABS: 1.0000 | ORAL_TABLET | Freq: Four times a day (QID) | ORAL | 0 refills | Status: AC | PRN

## 2019-03-18 MED ORDER — DEXAMETHASONE SODIUM PHOSPHATE 10 MG/ML IJ SOLN
INTRAMUSCULAR | Status: AC
  Filled 2019-03-18: qty 1

## 2019-03-18 MED ORDER — FENTANYL CITRATE (PF) 100 MCG/2ML IJ SOLN
INTRAMUSCULAR | Status: AC
  Filled 2019-03-18: qty 2

## 2019-03-18 MED ORDER — MEPERIDINE HCL 50 MG/ML IJ SOLN: 6.2500 mg | INTRAMUSCULAR | Status: DC | PRN

## 2019-03-18 SURGICAL SUPPLY — 27 items
BAG DRAIN CYSTO-URO LG1000N (MISCELLANEOUS) ×3 IMPLANT
BASKET ZERO TIP 1.9FR (BASKET) IMPLANT
BRUSH SCRUB EZ 1% IODOPHOR (MISCELLANEOUS) ×3 IMPLANT
CATH URETL 5X70 OPEN END (CATHETERS) ×2 IMPLANT
CNTNR SPEC 2.5X3XGRAD LEK (MISCELLANEOUS)
CONT SPEC 4OZ STER OR WHT (MISCELLANEOUS)
CONTAINER SPEC 2.5X3XGRAD LEK (MISCELLANEOUS) IMPLANT
DRAPE UTILITY 15X26 TOWEL STRL (DRAPES) ×3 IMPLANT
FIBER LASER LITHO 273 (Laser) ×2 IMPLANT
GLOVE BIO SURGEON STRL SZ8 (GLOVE) ×3 IMPLANT
GOWN STRL REUS W/ TWL LRG LVL3 (GOWN DISPOSABLE) ×1 IMPLANT
GOWN STRL REUS W/ TWL XL LVL3 (GOWN DISPOSABLE) ×1 IMPLANT
GOWN STRL REUS W/TWL LRG LVL3 (GOWN DISPOSABLE) ×2
GOWN STRL REUS W/TWL XL LVL3 (GOWN DISPOSABLE) ×2
GUIDEWIRE STR DUAL SENSOR (WIRE) ×3 IMPLANT
INFUSOR MANOMETER BAG 3000ML (MISCELLANEOUS) ×1 IMPLANT
INTRODUCER DILATOR DOUBLE (INTRODUCER) IMPLANT
KIT TURNOVER CYSTO (KITS) ×3 IMPLANT
PACK CYSTO AR (MISCELLANEOUS) ×3 IMPLANT
SET CYSTO W/LG BORE CLAMP LF (SET/KITS/TRAYS/PACK) ×3 IMPLANT
SHEATH URETERAL 12FRX35CM (MISCELLANEOUS) IMPLANT
SOL .9 NS 3000ML IRR  AL (IV SOLUTION) ×2
SOL .9 NS 3000ML IRR UROMATIC (IV SOLUTION) ×1 IMPLANT
STENT URET 6FRX24 CONTOUR (STENTS) IMPLANT
STENT URET 6FRX26 CONTOUR (STENTS) ×2 IMPLANT
SURGILUBE 2OZ TUBE FLIPTOP (MISCELLANEOUS) ×3 IMPLANT
WATER STERILE IRR 1000ML POUR (IV SOLUTION) ×3 IMPLANT

## 2019-03-18 NOTE — Interval H&P Note (Signed)
History and Physical Interval Note:  03/18/2019 7:25 AM  Carlos Bauer  has presented today for surgery, with the diagnosis of Left distal ureteral calculus with obstruction.  The various methods of treatment have been discussed with the patient and family. After consideration of risks, benefits and other options for treatment, the patient has consented to  Procedure(s): CYSTOSCOPY/URETEROSCOPY/HOLMIUM LASER/STENT PLACEMENT (Left) as a surgical intervention.  The patient's history has been reviewed, patient examined, no change in status, stable for surgery.  I have reviewed the patient's chart and labs.  Questions were answered to the patient's satisfaction.     Yovana Scogin C Rishith Siddoway

## 2019-03-18 NOTE — Anesthesia Post-op Follow-up Note (Signed)
Anesthesia QCDR form completed.        

## 2019-03-18 NOTE — Anesthesia Postprocedure Evaluation (Signed)
Anesthesia Post Note  Patient: Carlos Bauer  Procedure(s) Performed: CYSTOSCOPY/URETEROSCOPY/HOLMIUM LASER/STENT PLACEMENT (Left )  Patient location during evaluation: PACU Anesthesia Type: General Level of consciousness: awake and alert and oriented Pain management: pain level controlled Vital Signs Assessment: post-procedure vital signs reviewed and stable Respiratory status: spontaneous breathing, nonlabored ventilation and respiratory function stable Cardiovascular status: blood pressure returned to baseline and stable Postop Assessment: no signs of nausea or vomiting Anesthetic complications: no     Last Vitals:  Vitals:   03/18/19 0929 03/18/19 0959  BP: 140/88 135/80  Pulse: (!) 57 (!) 56  Resp: 16 16  Temp: 36.4 C   SpO2: 99% 99%    Last Pain:  Vitals:   03/18/19 0929  TempSrc: Temporal  PainSc: 4                  Gesenia Bantz

## 2019-03-18 NOTE — Op Note (Signed)
Preoperative diagnosis: Left ureteral calculus  Postoperative diagnosis: Left ureteral calculus  Procedure:  1. Cystoscopy 2. Left ureteroscopy and stone removal 3. Ureteroscopic laser lithotripsy 4. Left ureteral stent placement (6 FR) 26 cm 5. Left retrograde pyelography with interpretation  Surgeon: Lorin Picket C. Quention Mcneill, M.D.  Anesthesia: General  Complications: None  Intraoperative findings:  1.  Left retrograde pyelography post procedure showed no filling defects, stone fragments or contrast extravasation  EBL: Minimal  Specimens: 1. Calculus fragments for analysis   Indication: Carlos Bauer is a 43 y.o. year old patient who presented to the ED on 02/15/2019 with left renal colic and CT showed a 6 mm left distal ureteral calculus and a 3 mm left renal calculus.  I initially saw him on 3/26 and he initially desired a brief trial of passage.  He was seen in follow-up earlier this week and was complaining of increased pain and then ablated pass a stone.  KUB remarkable for the 6 mm distal calculus and a second calcification just proximal to the larger stone. After reviewing the management options for treatment, the patient elected to proceed with the above surgical procedure(s). We have discussed the potential benefits and risks of the procedure, side effects of the proposed treatment, the likelihood of the patient achieving the goals of the procedure, and any potential problems that might occur during the procedure or recuperation. Informed consent has been obtained.  Description of procedure:  The patient was taken to the operating room and general anesthesia was induced.  The patient was placed in the dorsal lithotomy position, prepped and draped in the usual sterile fashion, and preoperative antibiotics were administered. A preoperative time-out was performed.   A 21 French cystoscope was lubricated and passed under direct vision.  The urethra was normal in caliber without  stricture.  The prostate demonstrated mild lateral lobe enlargement.  Panendoscopy was performed and the bladder mucosa showed no erythema, solid or papillary lesions.  Attention was directed to the left ureteral orifice and a 0.038 Sensor wire was then advanced up the ureter into the renal pelvis under fluoroscopic guidance.  A 4.5 Fr semirigid ureteroscope was then advanced into the ureter next to the guidewire and the calculus was identified in the distal ureter.  The the calculus was then fragmented with a 273 micron holmium laser fiber on a setting of 0.2 J and frequency of 10 hz which was increased to 20 Hz.   All stone fragments were then removed from the ureter with a zero tip nitinol basket.  Reinspection of the ureter revealed no remaining visible stones or fragments.   Retrograde pyelogram was performed with findings as described above.  The wire was then backloaded through the cystoscope and a 6 French/26 cm double-J ureteral stent was advance over the wire using Seldinger technique.  The bladder was then emptied and the procedure ended.  The patient appeared to tolerate the procedure well and without complications.  After anesthetic reversal the patient was transported to the PACU in stable condition.   Plan: There were no significant inflammatory changes in the procedure was atraumatic.  The stent was left attached to a tether and he may remove on Monday, 03/21/2019.  Irineo Axon, MD

## 2019-03-18 NOTE — Transfer of Care (Signed)
Immediate Anesthesia Transfer of Care Note  Patient: Carlos Bauer  Procedure(s) Performed: CYSTOSCOPY/URETEROSCOPY/HOLMIUM LASER/STENT PLACEMENT (Left )  Patient Location: PACU  Anesthesia Type:General  Level of Consciousness: sedated and responds to stimulation  Airway & Oxygen Therapy: Patient Spontanous Breathing and Patient connected to face mask oxygen  Post-op Assessment: Report given to RN and Post -op Vital signs reviewed and stable  Post vital signs: Reviewed and stable  Last Vitals:  Vitals Value Taken Time  BP 114/66 03/18/2019  8:45 AM  Temp 36.2 C 03/18/2019  8:45 AM  Pulse 71 03/18/2019  8:46 AM  Resp 20 03/18/2019  8:46 AM  SpO2 95 % 03/18/2019  8:46 AM  Vitals shown include unvalidated device data.  Last Pain:  Vitals:   03/18/19 0621  TempSrc: Temporal  PainSc: 7          Complications: No apparent anesthesia complications

## 2019-03-18 NOTE — Anesthesia Preprocedure Evaluation (Signed)
Anesthesia Evaluation  Patient identified by MRN, date of birth, ID band Patient awake    Reviewed: Allergy & Precautions, NPO status , Patient's Chart, lab work & pertinent test results  History of Anesthesia Complications Negative for: history of anesthetic complications  Airway Mallampati: II  TM Distance: >3 FB Neck ROM: Full    Dental no notable dental hx.    Pulmonary neg pulmonary ROS, neg sleep apnea, neg COPD,    breath sounds clear to auscultation- rhonchi (-) wheezing      Cardiovascular Exercise Tolerance: Good (-) hypertension(-) CAD, (-) Past MI, (-) Cardiac Stents and (-) CABG  Rhythm:Regular Rate:Normal - Systolic murmurs and - Diastolic murmurs    Neuro/Psych neg Seizures PSYCHIATRIC DISORDERS Anxiety Depression negative neurological ROS     GI/Hepatic negative GI ROS, Neg liver ROS,   Endo/Other  negative endocrine ROSneg diabetes  Renal/GU Renal disease: nephrolithiasis.     Musculoskeletal negative musculoskeletal ROS (+)   Abdominal (+) + obese,   Peds  Hematology negative hematology ROS (+)   Anesthesia Other Findings Past Medical History: No date: Anxiety     Comment:  NO MEDS  No date: Depression     Comment:  NO MEDS No date: History of kidney stones   Reproductive/Obstetrics                             Anesthesia Physical Anesthesia Plan  ASA: II  Anesthesia Plan: General   Post-op Pain Management:    Induction: Intravenous  PONV Risk Score and Plan: 1 and Ondansetron and Midazolam  Airway Management Planned: Oral ETT  Additional Equipment:   Intra-op Plan:   Post-operative Plan: Extubation in OR  Informed Consent: I have reviewed the patients History and Physical, chart, labs and discussed the procedure including the risks, benefits and alternatives for the proposed anesthesia with the patient or authorized representative who has indicated  his/her understanding and acceptance.     Dental advisory given  Plan Discussed with: CRNA and Anesthesiologist  Anesthesia Plan Comments:         Anesthesia Quick Evaluation

## 2019-03-18 NOTE — Anesthesia Procedure Notes (Signed)
Procedure Name: Intubation Date/Time: 03/18/2019 7:44 AM Performed by: Jonna Clark, CRNA Pre-anesthesia Checklist: Patient identified, Patient being monitored, Timeout performed, Emergency Drugs available and Suction available Patient Re-evaluated:Patient Re-evaluated prior to induction Oxygen Delivery Method: Circle system utilized Preoxygenation: Pre-oxygenation with 100% oxygen Induction Type: IV induction Ventilation: Mask ventilation without difficulty Laryngoscope Size: Mac and 3 Grade View: Grade II Tube type: Oral Tube size: 7.5 mm Number of attempts: 1 Placement Confirmation: ETT inserted through vocal cords under direct vision,  positive ETCO2 and breath sounds checked- equal and bilateral Secured at: 21 cm Tube secured with: Tape Dental Injury: Teeth and Oropharynx as per pre-operative assessment

## 2019-03-18 NOTE — Discharge Instructions (Signed)
AMBULATORY SURGERY  DISCHARGE INSTRUCTIONS   1) The drugs that you were given will stay in your system until tomorrow so for the next 24 hours you should not:  A) Drive an automobile B) Make any legal decisions C) Drink any alcoholic beverage   2) You may resume regular meals tomorrow.  Today it is better to start with liquids and gradually work up to solid foods.  You may eat anything you prefer, but it is better to start with liquids, then soup and crackers, and gradually work up to solid foods.   3) Please notify your doctor immediately if you have any unusual bleeding, trouble breathing, redness and pain at the surgery site, drainage, fever, or pain not relieved by medication. 4)   5) Your post-operative visit with Dr.                                     is: Date:                        Time:    Please call to schedule your post-operative visit.  6) Additional Instructions:     DISCHARGE INSTRUCTIONS FOR KIDNEY STONE/URETERAL STENT   MEDICATIONS:  1. Resume all your other meds from home.  2.  AZO (over-the-counter) can help with the burning/stinging when you urinate. 3.  Oxycodone is for moderate/severe pain, Rx was sent to your pharmacy. 4.  Oxybutynin is for bladder irritation.  Rx was sent to your pharmacy  ACTIVITY:  1. May resume regular activities in 24 hours. 2. No driving while on narcotic pain medications  3. Drink plenty of water  4. Continue to walk at home - you can still get blood clots when you are at home, so keep active, but don't over do it.  5. May return to work/school tomorrow or when you feel ready   BATHING:  1. You can shower. 2. You have a string coming from your urethra: The stent string is attached to your ureteral stent. Do not pull on this.   SIGNS/SYMPTOMS TO CALL:  Please call us if you have a fever greater than 101.5, uncontrolled nausea/vomiting, uncontrolled pain, dizziness, unable to urinate, bloody urine, chest pain, shortness  of breath, leg swelling, leg pain, or any other concerns or questions.   You can reach [place] at [phone].   FOLLOW-UP:  1. You we will be contacted for a follow-up appointment. 2. You have a string attached to your stent, you may remove it on Monday 4/13. To do this, pull the string until the stent is completely removed. You may feel an odd sensation in your back.

## 2019-03-21 ENCOUNTER — Telehealth: Payer: Self-pay | Admitting: Urology

## 2019-03-21 ENCOUNTER — Encounter: Payer: Self-pay | Admitting: Urology

## 2019-03-21 NOTE — Telephone Encounter (Signed)
Pt called and asked if he should stop taking his Tamsulosin after his surgery. Please Advise.

## 2019-03-21 NOTE — Telephone Encounter (Signed)
Contacted pt advised him to continue all medications that were prescribed until we see him back in office for post op. Pt gave verbal understanding, however states that he is having some pain and getting low on meds. Pt requests refill.  Pharmacy in chart is correct.  Phone called performed using interpreter services.

## 2019-03-22 NOTE — Telephone Encounter (Signed)
Pt removed his stent yesterday, he told me this while speaking to him yesterday. Called pt today using interpreter services, to access pain. LM for pt to call back.

## 2019-03-22 NOTE — Telephone Encounter (Signed)
Please find out if patient removed his stent yesterday and if so is he still having pain?

## 2019-03-23 NOTE — Telephone Encounter (Signed)
EK#[id number]  PAtient notified he is doing well no pain a little blood noted, when should he follow up?

## 2019-03-24 ENCOUNTER — Telehealth: Payer: Self-pay | Admitting: Urology

## 2019-03-24 NOTE — Telephone Encounter (Signed)
When I called the pt via interpreter services to change appt to 6 weeks post op, pt feels comfortable with new appt date/time but pt also stated he is having intermittent pain and only has 2 pain pills left.  Pt also taking advil. Please advise.

## 2019-03-24 NOTE — Telephone Encounter (Signed)
Please change postop follow-up approximately 6 weeks and schedule for a Tuesday

## 2019-03-24 NOTE — Telephone Encounter (Signed)
ID # [id number]  Spoke with patient and he states pain is only when laying down and wanted to know if he could stop taking pain medication and switch to Advil. I explained that this was fine and OTC NSAIDs would be preferred at this time. If pain worsens or fever to call back for possible imaging or follow up needed. Patient also states he is not regular with bowls and was encouraged to use stool softner and that sometimes this can be due to the pain medication and to increase water intake

## 2019-03-29 LAB — CALCULI, WITH PHOTOGRAPH (CLINICAL LAB)
Calcium Oxalate Monohydrate: 100 %
Weight Calculi: 20 mg

## 2019-04-13 ENCOUNTER — Ambulatory Visit: Payer: Self-pay | Admitting: Urology

## 2019-05-03 ENCOUNTER — Ambulatory Visit: Payer: Self-pay | Admitting: Urology

## 2019-05-12 IMAGING — CT CT RENAL STONE PROTOCOL
2 of 4 series · 17 of 46 positions shown, 19 images · non-contrast
Comparison: None.

CLINICAL DATA: Left-sided flank pain for several hours

EXAM:
CT ABDOMEN AND PELVIS WITHOUT CONTRAST
TECHNIQUE: Multidetector CT imaging of the abdomen and pelvis was performed
following the standard protocol without IV contrast.

[Series 2: stone full standard · axial · 0.76mm/px · z∈[-990,-535]mm · 14 of 101 slices shown, 16 images]
[im 5/101  soft-tissue]
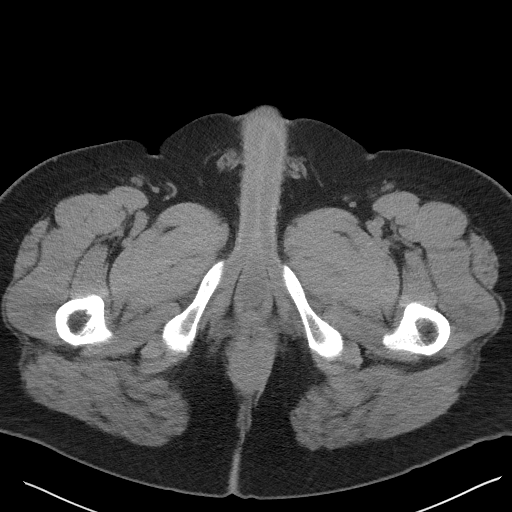
[im 5/101  bone]
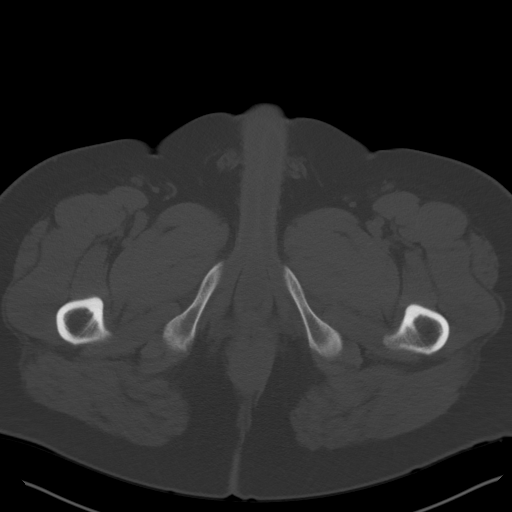
[im 14/101  soft-tissue]
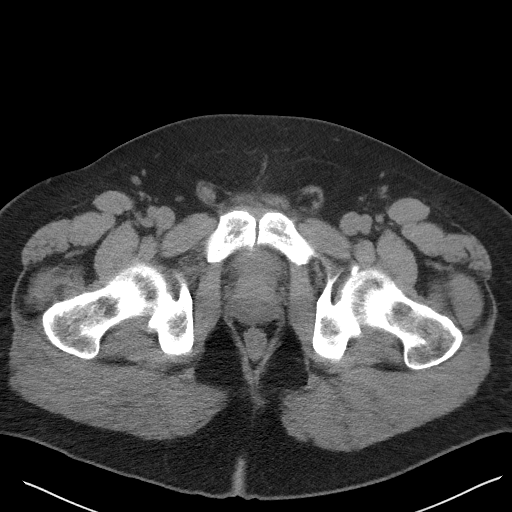
[im 18/101  soft-tissue]
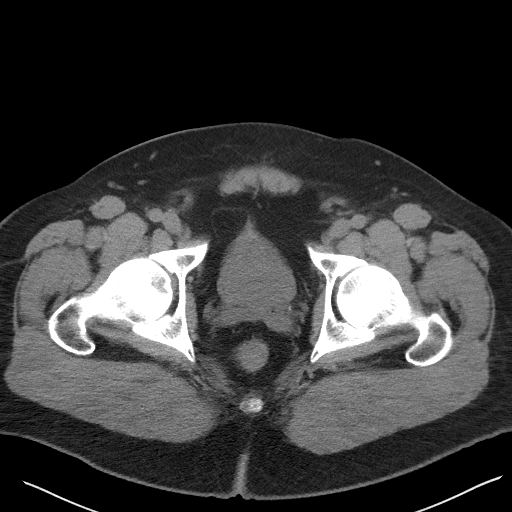
[im 27/101  soft-tissue]
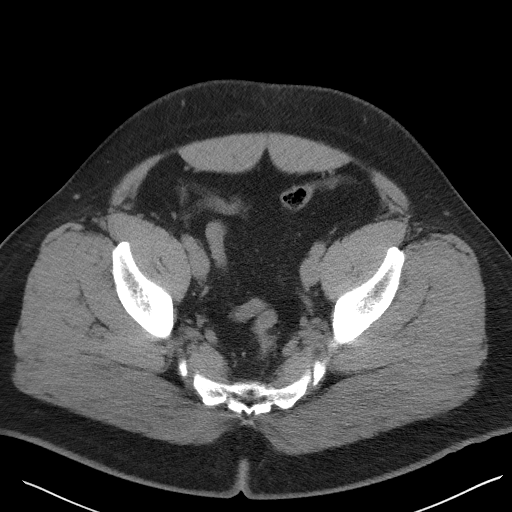
[im 35/101  soft-tissue]
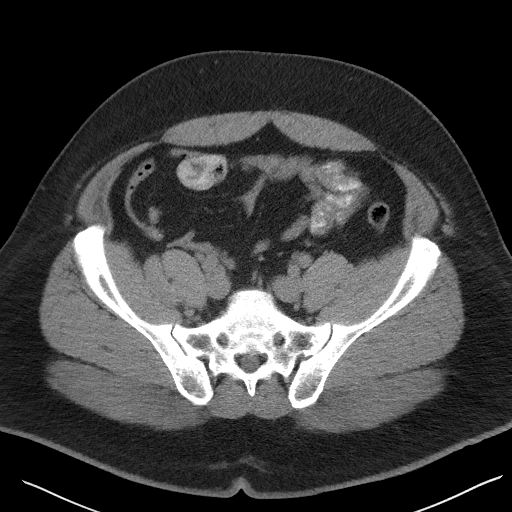
[im 40/101  soft-tissue]
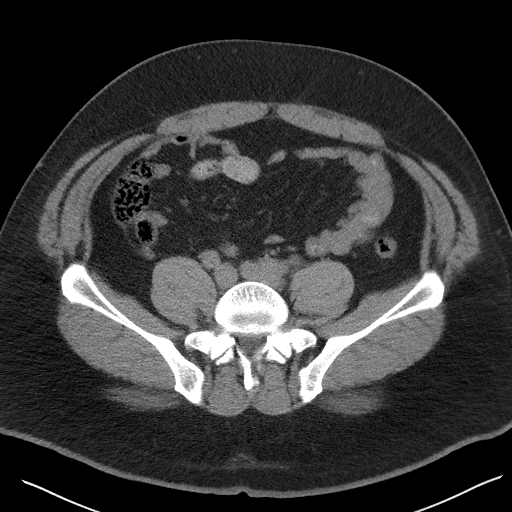
[im 48/101  soft-tissue]
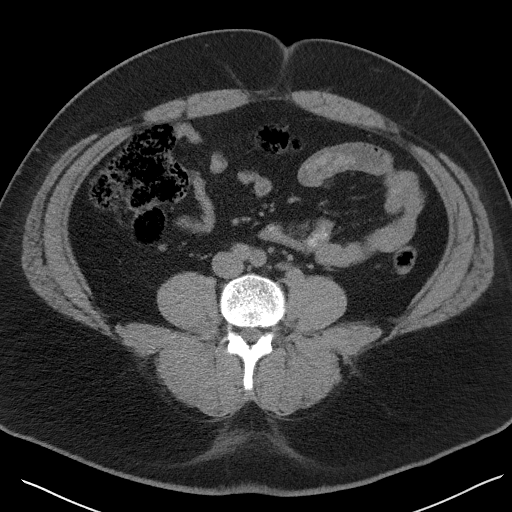
[im 53/101  soft-tissue]
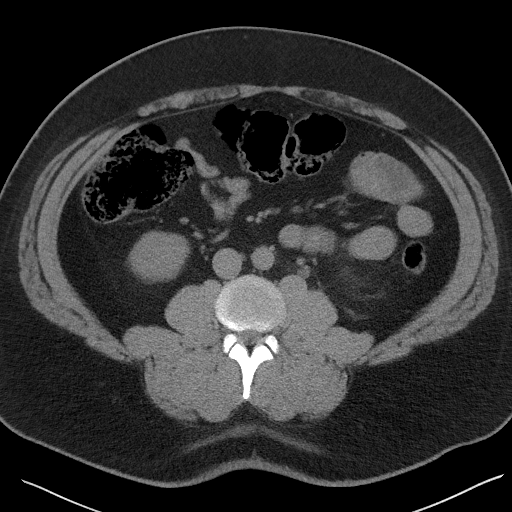
[im 61/101  soft-tissue]
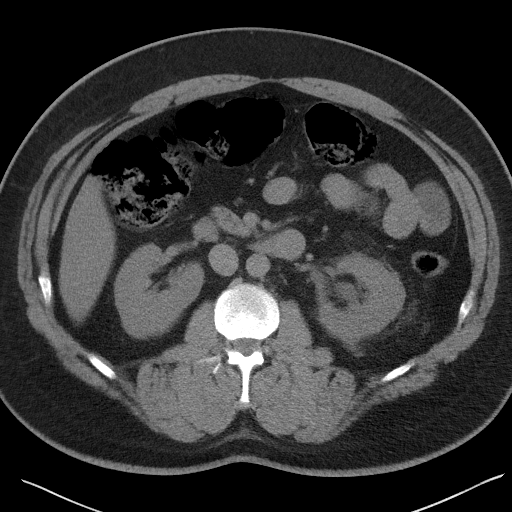
[im 61/101  bone]
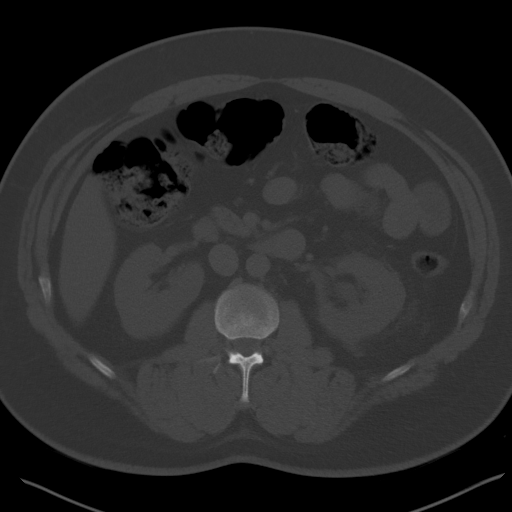
[im 66/101  soft-tissue]
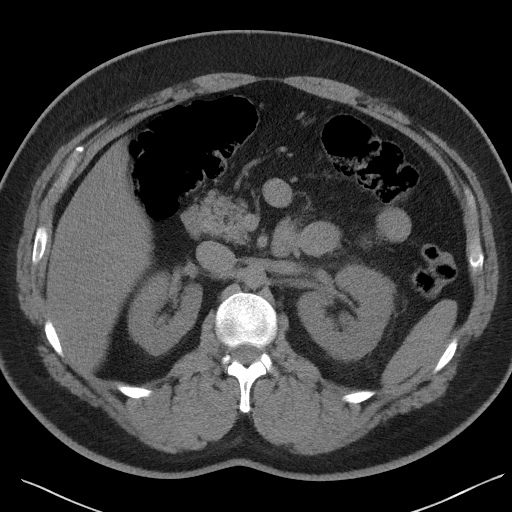
[im 74/101  soft-tissue]
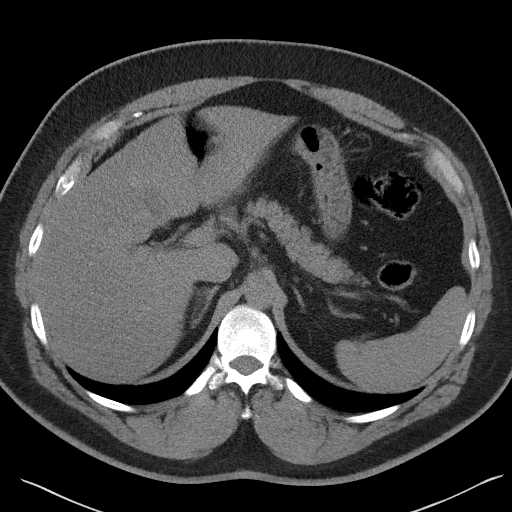
[im 83/101  soft-tissue]
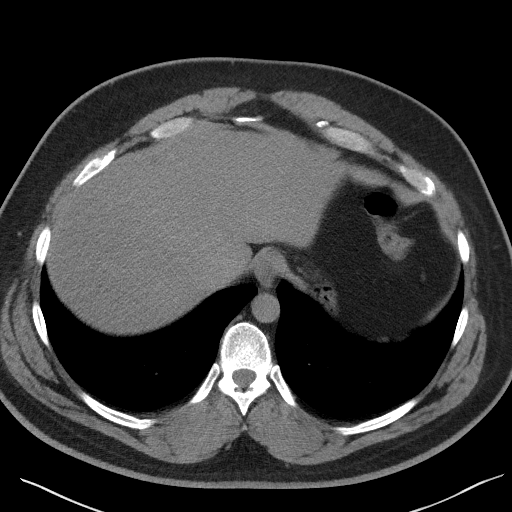
[im 87/101  soft-tissue]
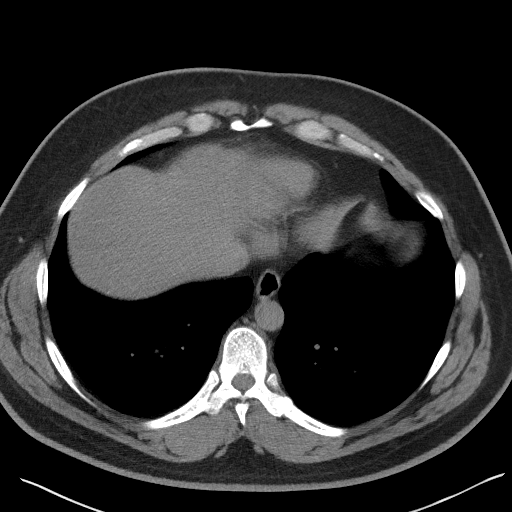
[im 96/101  soft-tissue]
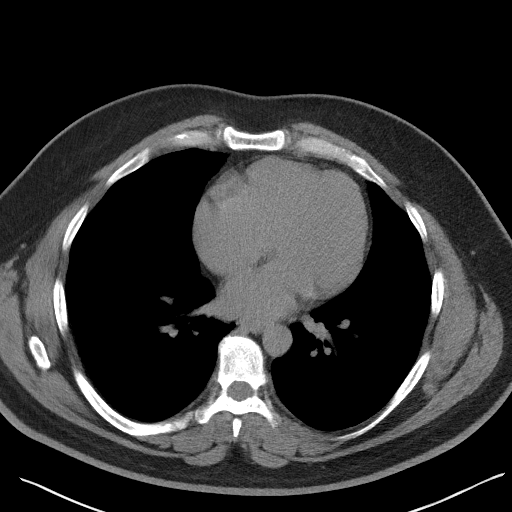

[Series 5: coronal · coronal · 0.88mm/px · 3 of 182 slices shown]
[im 61/182  soft-tissue]
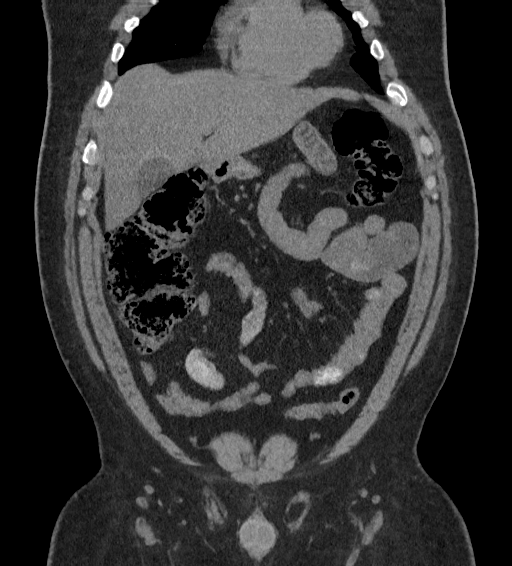
[im 81/182  soft-tissue]
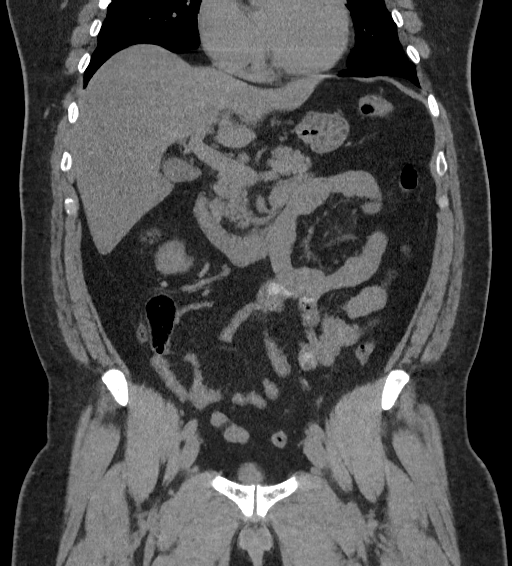
[im 101/182  soft-tissue]
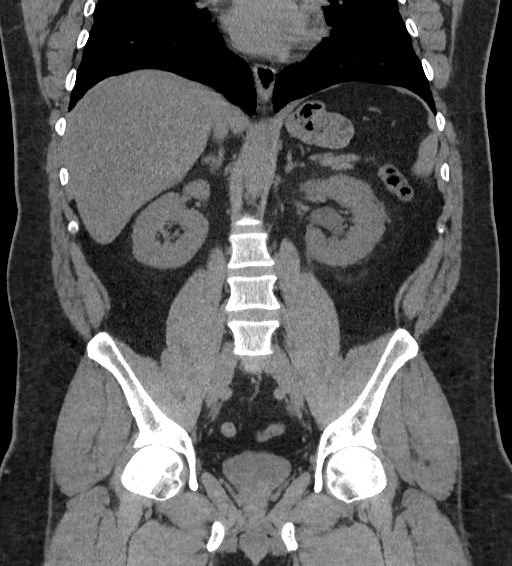

[17 of 46 positions shown; findings below may reference images not displayed]

FINDINGS: Lower chest: No acute abnormality.

Hepatobiliary: The liver and gallbladder are within normal limits.

Pancreas: Unremarkable. No pancreatic ductal dilatation or
surrounding inflammatory changes.

Spleen: Normal in size without focal abnormality.

Adrenals/Urinary Tract: Adrenal glands and right kidney are well
visualized and within normal limits. The bladder is decompressed.
Left kidney demonstrates increased perinephric stranding as well as
hydronephrosis secondary to a distal left ureteral stone measuring 6
mm. 3 mm left lower pole nonobstructing stone is noted.

Stomach/Bowel: The appendix is within normal limits. No obstructive
or inflammatory changes of the larger small-bowel are seen. The
stomach is within normal limits.

Vascular/Lymphatic: No significant vascular findings are present. No
enlarged abdominal or pelvic lymph nodes.

Reproductive: Prostate is unremarkable.

Other: No abdominal wall hernia or abnormality. No abdominopelvic
ascites.

Musculoskeletal: No acute or significant osseous findings.
IMPRESSION: 6 mm distal left ureteral stone with obstructive change in
perinephric stranding.

Nonobstructing lower pole left renal stone.

No other focal abnormality is seen.

## 2019-05-16 ENCOUNTER — Ambulatory Visit: Payer: Self-pay | Admitting: Urology

## 2019-06-02 ENCOUNTER — Encounter: Payer: Self-pay | Admitting: Urology

## 2019-06-02 ENCOUNTER — Ambulatory Visit (INDEPENDENT_AMBULATORY_CARE_PROVIDER_SITE_OTHER): Payer: Self-pay | Admitting: Urology

## 2019-06-02 ENCOUNTER — Other Ambulatory Visit: Payer: Self-pay

## 2019-06-02 VITALS — BP 128/85 | HR 54 | Ht 67.0 in | Wt 271.0 lb

## 2019-06-02 DIAGNOSIS — Z09 Encounter for follow-up examination after completed treatment for conditions other than malignant neoplasm: Secondary | ICD-10-CM

## 2019-06-02 DIAGNOSIS — N200 Calculus of kidney: Secondary | ICD-10-CM

## 2019-06-02 NOTE — Progress Notes (Signed)
06/02/2019 10:52 AM   Carlos Bauer 1976/11/03 [id number]  Referring provider: No referring provider defined for this encounter.  Chief Complaint  Patient presents with  . Routine Post Op    HPI: 43 year old male presents for postop follow-up.  He is status post ureteroscopic removal of a 6 mm left distal ureteral calculus on 03/18/2019.  He removed his stent on postop day 3 and had no problems.  His only complaint has been a "warm sensation in his left side" that is not severe.  Sometimes it is related to eating.  He has no voiding complaints.  Stone analysis was 100% calcium oxalate monohydrate.  He has a small, nonobstructing left renal calculus.   PMH: Past Medical History:  Diagnosis Date  . Anxiety    NO MEDS   . Depression    NO MEDS  . History of kidney stones     Surgical History: Past Surgical History:  Procedure Laterality Date  . CYSTOSCOPY/URETEROSCOPY/HOLMIUM LASER/STENT PLACEMENT Left 03/18/2019   Procedure: CYSTOSCOPY/URETEROSCOPY/HOLMIUM LASER/STENT PLACEMENT;  Surgeon: Riki AltesStoioff, Sama Arauz C, MD;  Location: [facility] ORS;  Service: Urology;  Laterality: Left;  . NO PAST SURGERIES      Home Medications:  Allergies as of 06/02/2019      Reactions   Nyquil Multi-symptom [pseudoeph-doxylamine-dm-apap] Swelling      Medication List       Accurate as of June 02, 2019 10:52 AM. If you have any questions, ask your nurse or doctor.        oxybutynin 5 MG tablet Commonly known as: DITROPAN 1 tab tid prn frequency,urgency, bladder spasm   oxyCODONE-acetaminophen 7.5-325 MG tablet Commonly known as: Percocet Take 1 tablet by mouth every 6 (six) hours as needed for severe pain.   tamsulosin 0.4 MG Caps capsule Commonly known as: FLOMAX Take 1 capsule (0.4 mg total) by mouth daily after breakfast.       Allergies:  Allergies  Allergen Reactions  . Nyquil Multi-Symptom [Pseudoeph-Doxylamine-Dm-Apap] Swelling    Family History: No family  history on file.  Social History:  reports that he has never smoked. He has never used smokeless tobacco. He reports previous alcohol use. He reports that he does not use drugs.  ROS: UROLOGY Frequent Urination?: No Hard to postpone urination?: Yes Burning/pain with urination?: No Get up at night to urinate?: No Leakage of urine?: Yes Urine stream starts and stops?: No Trouble starting stream?: No Do you have to strain to urinate?: No Blood in urine?: No Urinary tract infection?: No Sexually transmitted disease?: No Injury to kidneys or bladder?: No Painful intercourse?: No Weak stream?: No Erection problems?: No Penile pain?: Yes  Gastrointestinal Nausea?: Yes Vomiting?: No Indigestion/heartburn?: No Diarrhea?: No Constipation?: No  Constitutional Fever: No Night sweats?: No Weight loss?: No Fatigue?: Yes  Skin Skin rash/lesions?: Yes Itching?: Yes  Eyes Blurred vision?: Yes Double vision?: No  Ears/Nose/Throat Sore throat?: No Sinus problems?: No  Hematologic/Lymphatic Swollen glands?: No Easy bruising?: No  Cardiovascular Leg swelling?: No Chest pain?: No  Respiratory Cough?: Yes Shortness of breath?: No  Endocrine Excessive thirst?: No  Musculoskeletal Back pain?: Yes Joint pain?: Yes  Neurological Headaches?: No Dizziness?: Yes  Psychologic Depression?: No Anxiety?: Yes  Physical Exam: BP 128/85 (BP Location: Left Arm, Patient Position: Sitting)   Pulse (!) 54   Ht 5\' 7"  (1.702 m)   Wt 271 lb (122.9 kg)   BMI 42.44 kg/m   Constitutional:  Alert and oriented, No acute distress. HEENT: [place] AT, moist  mucus membranes.  Trachea midline, no masses. Cardiovascular: No clubbing, cyanosis, or edema. Respiratory: Normal respiratory effort, no increased work of breathing. Skin: No rashes, bruises or suspicious lesions. Neurologic: Grossly intact, no focal deficits, moving all 4 extremities. Psychiatric: Normal mood and affect.    Assessment & Plan:   Doing well status post ureteroscopic stone removal.  His left-sided discomfort unlikely urologic in etiology.  A renal ultrasound was ordered and he will be notified with results.  If ultrasound is unremarkable would recommend he follow-up with his PCP.  Stone prevention guidelines were discussed.  He does not desire to pursue a metabolic evaluation.  Return in about 6 months (around 12/02/2019) for Recheck, KUB.   Abbie Sons, [place] [address], [place], [place] [address] [phone]

## 2019-11-23 ENCOUNTER — Other Ambulatory Visit: Payer: Self-pay

## 2019-11-23 ENCOUNTER — Ambulatory Visit
Admission: RE | Admit: 2019-11-23 | Discharge: 2019-11-23 | Disposition: A | Discharge status: Home / Self Care | Payer: Self-pay | Source: Ambulatory Visit | Attending: Urology | Admitting: Urology

## 2019-11-23 DIAGNOSIS — N200 Calculus of kidney: Secondary | ICD-10-CM | POA: Insufficient documentation

## 2019-11-24 ENCOUNTER — Ambulatory Visit: Payer: Self-pay

## 2019-11-28 ENCOUNTER — Encounter: Payer: Self-pay | Admitting: Urology

## 2019-11-28 ENCOUNTER — Ambulatory Visit: Payer: Self-pay | Admitting: Urology

## 2019-11-29 ENCOUNTER — Ambulatory Visit: Payer: Self-pay | Admitting: Urology

## 2019-11-30 ENCOUNTER — Ambulatory Visit: Payer: Self-pay | Admitting: Urology

## 2020-02-17 IMAGING — US US RENAL
1 series · 14 of 25 positions shown · non-contrast
Comparison: Prior CT from 02/15/2019.

CLINICAL DATA: Initial evaluation for nephrolithiasis.

EXAM:
RENAL / URINARY TRACT ULTRASOUND COMPLETE

[Series 1: us renal · 14 of 36 slices shown]
[im 1/36]
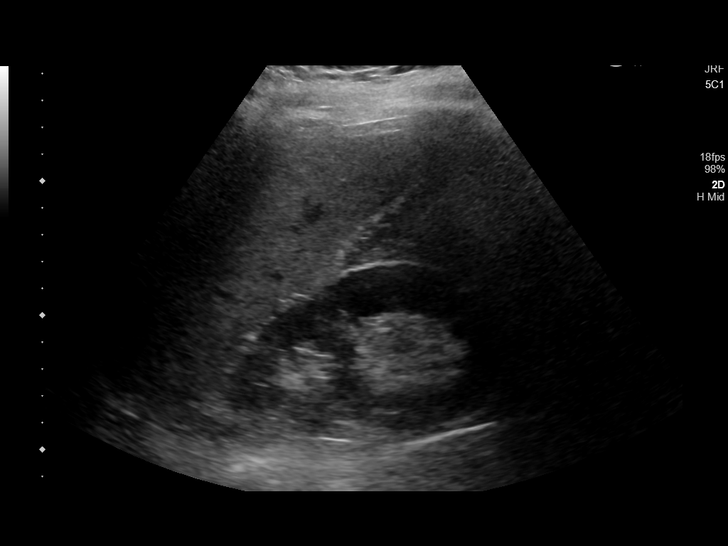
[im 3/36]
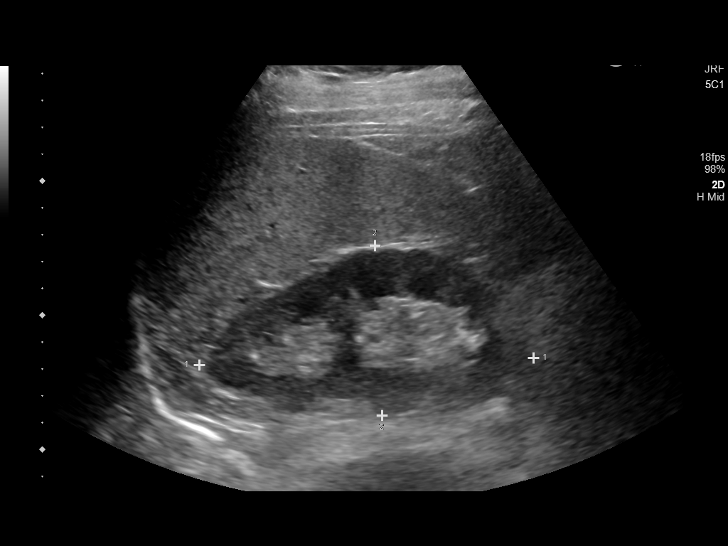
[im 6/36]
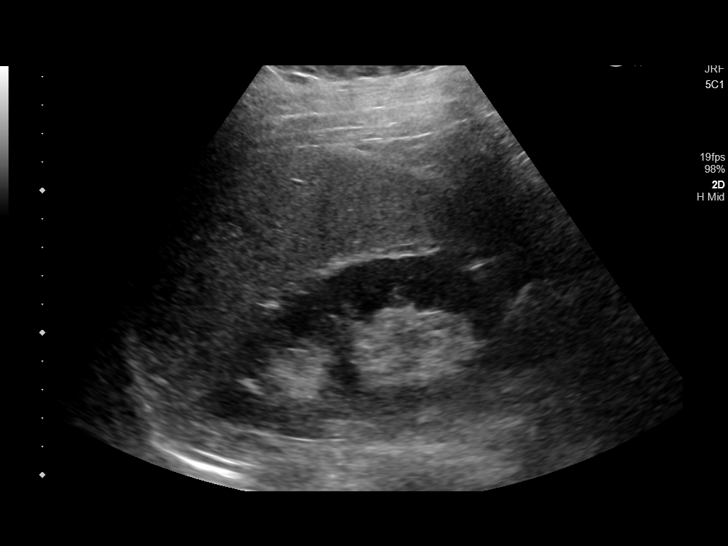
[im 9/36]
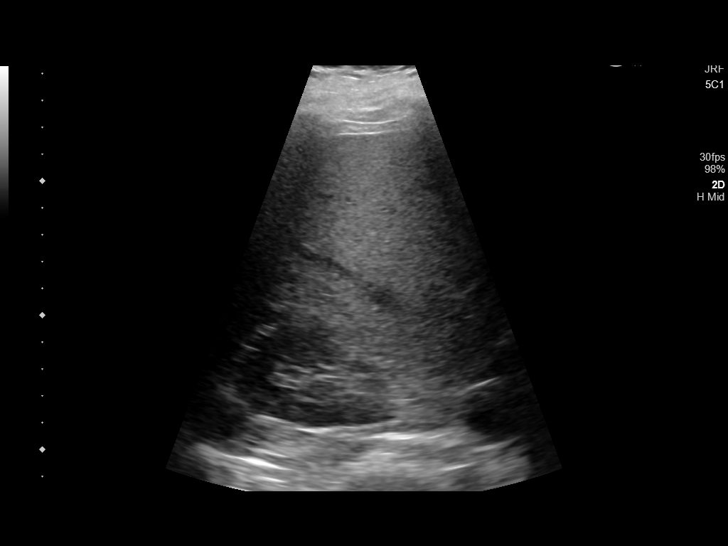
[im 12/36]
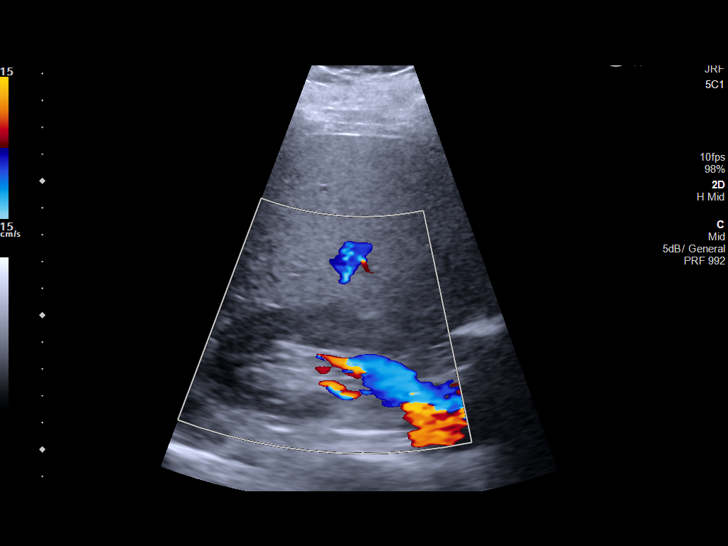
[im 14/36]
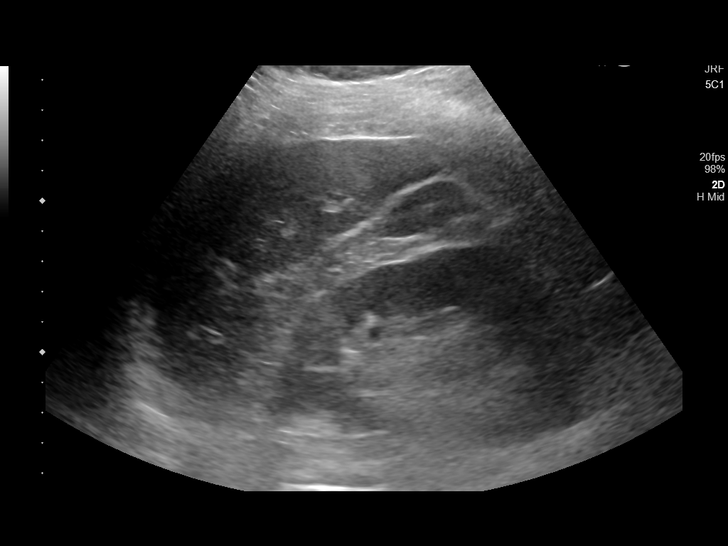
[im 17/36]
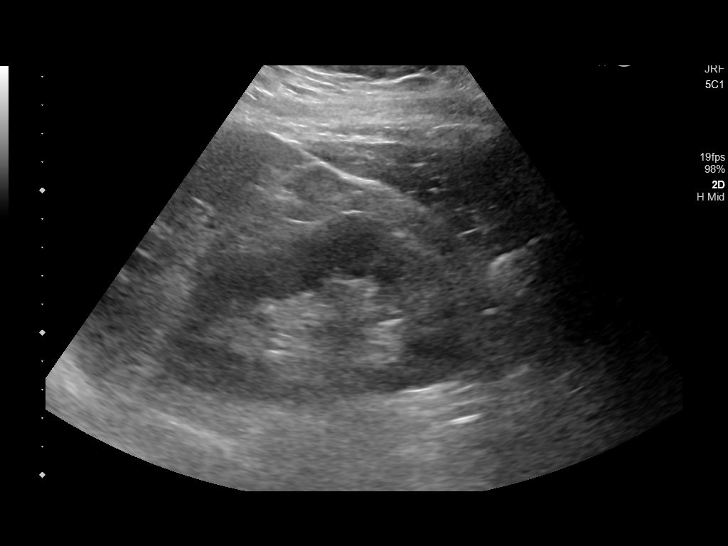
[im 19/36]
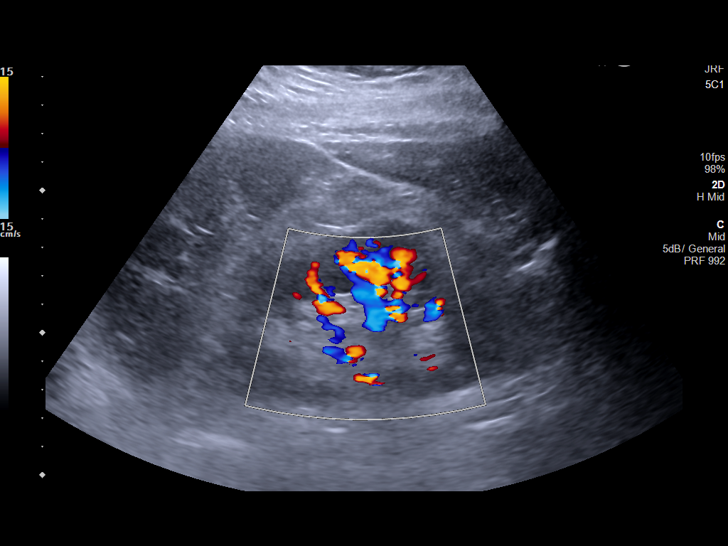
[im 22/36]
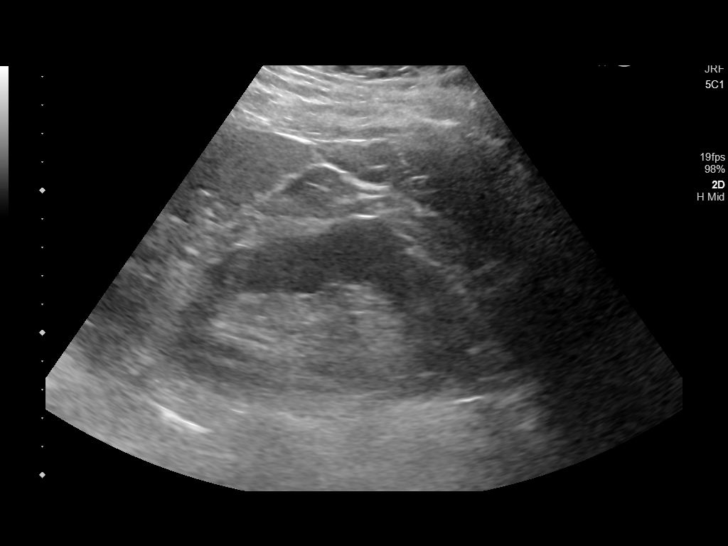
[im 24/36]
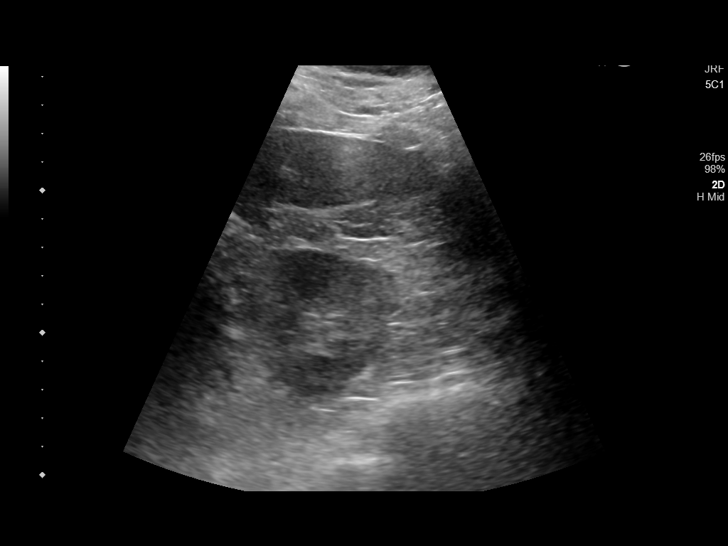
[im 27/36]
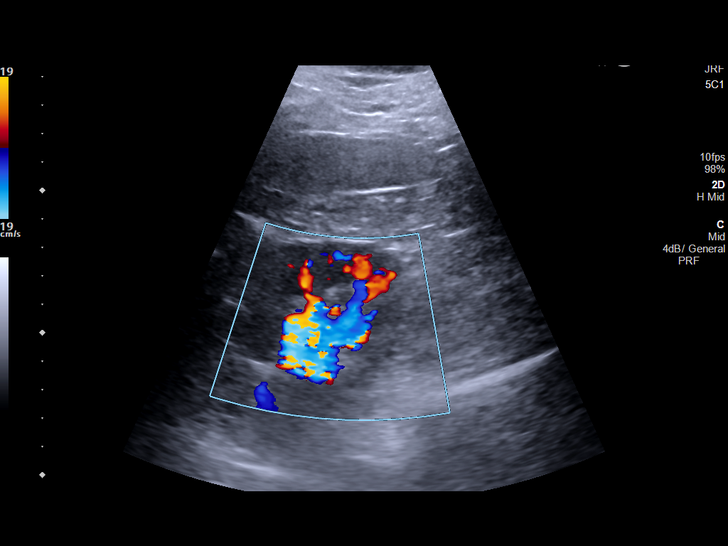
[im 30/36]
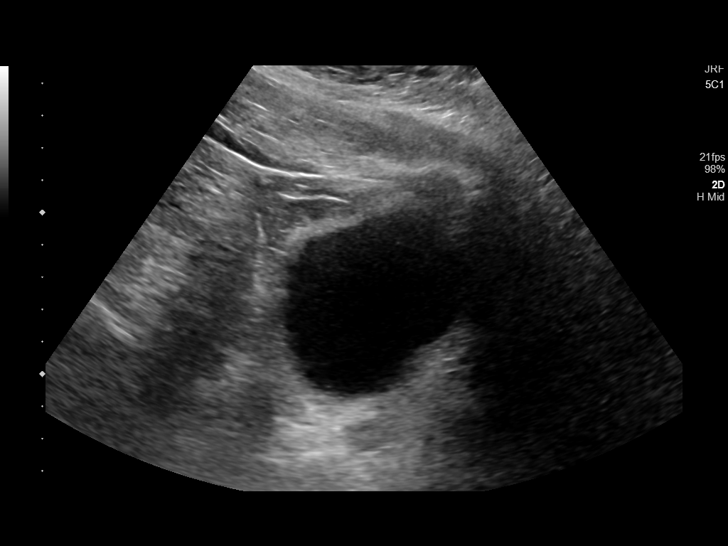
[im 33/36]
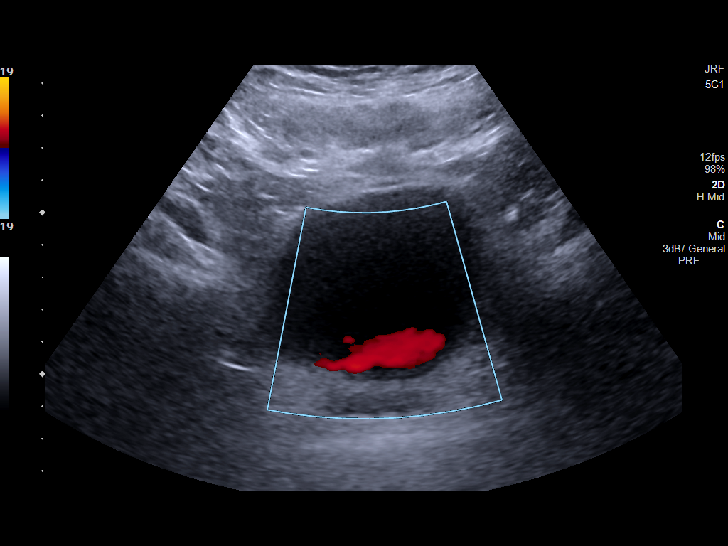
[im 36/36]
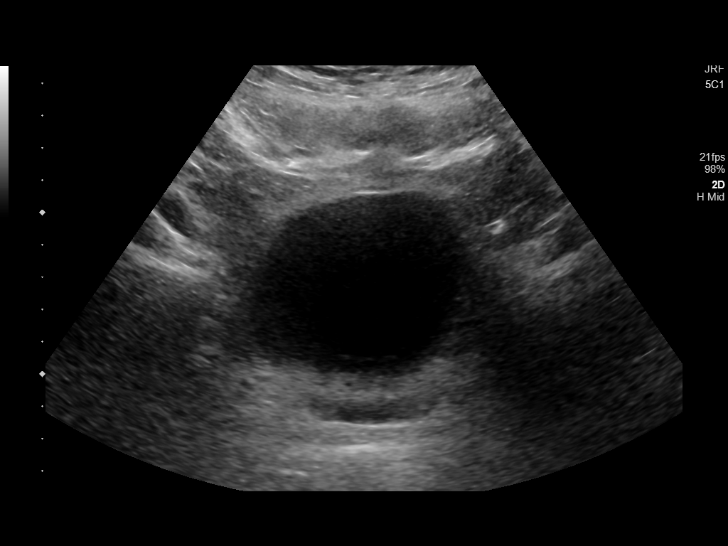

[14 of 25 positions shown; findings below may reference images not displayed]

FINDINGS: Right Kidney:

Renal measurements: 12.5 x 6.3 x 6.2 cm = volume: 255 mL .
Echogenicity within normal limits. No mass or hydronephrosis
visualized. No visible nephrolithiasis.

Left Kidney:

Renal measurements: 11.3 x 6.4 x 6.1 cm = volume: 229 mL.
Echogenicity within normal limits. No mass or hydronephrosis
visualized. No visible nephrolithiasis.

Bladder:

Appears normal for degree of bladder distention. Bilateral ureteral
jets are visualized.

Other:

None.
IMPRESSION: Normal renal ultrasound. No visible nephrolithiasis or obstructive
uropathy.
